# Patient Record
Sex: Female | Born: 1990
Health system: Southern US, Community
[De-identification: ages and names within clinical notes are randomized; demographics above are authoritative.]

---

## 2013-03-20 ENCOUNTER — Emergency Department (HOSPITAL_COMMUNITY)
Admission: EM | Admit: 2013-03-20 | Discharge: 2013-03-20 | Disposition: A | Discharge status: Home / Self Care | Payer: No Typology Code available for payment source | Attending: Emergency Medicine | Admitting: Emergency Medicine

## 2013-03-20 ENCOUNTER — Encounter (HOSPITAL_COMMUNITY): Payer: Self-pay | Admitting: Emergency Medicine

## 2013-03-20 ENCOUNTER — Emergency Department (HOSPITAL_COMMUNITY): Payer: No Typology Code available for payment source

## 2013-03-20 DIAGNOSIS — Y92410 Unspecified street and highway as the place of occurrence of the external cause: Secondary | ICD-10-CM | POA: Insufficient documentation

## 2013-03-20 DIAGNOSIS — Y9389 Activity, other specified: Secondary | ICD-10-CM | POA: Insufficient documentation

## 2013-03-20 DIAGNOSIS — T148XXA Other injury of unspecified body region, initial encounter: Secondary | ICD-10-CM

## 2013-03-20 DIAGNOSIS — S139XXA Sprain of joints and ligaments of unspecified parts of neck, initial encounter: Secondary | ICD-10-CM | POA: Insufficient documentation

## 2013-03-20 DIAGNOSIS — M62838 Other muscle spasm: Secondary | ICD-10-CM | POA: Insufficient documentation

## 2013-03-20 MED ORDER — IBUPROFEN 400 MG PO TABS
800.0000 mg | ORAL_TABLET | Freq: Once | ORAL | Status: AC
  Administered 2013-03-20: 800 mg via ORAL
  Filled 2013-03-20: qty 2
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
## 2013-03-23 NOTE — ED Provider Notes (Signed)
Medical screening examination/treatment/procedure(s) were performed by non-physician practitioner and as supervising physician I was immediately available for consultation/collaboration.    Margeart Allender J. Jazira Maloney, MD 03/23/13 0431 

## 2014-07-24 ENCOUNTER — Emergency Department (HOSPITAL_COMMUNITY)
Admission: EM | Admit: 2014-07-24 | Discharge: 2014-07-24 | Disposition: A | Discharge status: Home / Self Care | Payer: No Typology Code available for payment source | Attending: Emergency Medicine | Admitting: Emergency Medicine

## 2014-07-24 ENCOUNTER — Encounter (HOSPITAL_COMMUNITY): Payer: Self-pay | Admitting: *Deleted

## 2014-07-24 DIAGNOSIS — B9689 Other specified bacterial agents as the cause of diseases classified elsewhere: Secondary | ICD-10-CM

## 2014-07-24 DIAGNOSIS — N760 Acute vaginitis: Secondary | ICD-10-CM | POA: Insufficient documentation

## 2014-07-24 DIAGNOSIS — N938 Other specified abnormal uterine and vaginal bleeding: Secondary | ICD-10-CM | POA: Insufficient documentation

## 2014-07-24 DIAGNOSIS — Z3202 Encounter for pregnancy test, result negative: Secondary | ICD-10-CM | POA: Insufficient documentation

## 2014-07-24 LAB — CBC WITH DIFFERENTIAL/PLATELET
Basophils Absolute: 0 10*3/uL (ref 0.0–0.1)
Basophils Relative: 0 % (ref 0–1)
EOS PCT: 1 % (ref 0–5)
Eosinophils Absolute: 0.1 10*3/uL (ref 0.0–0.7)
HCT: 34.5 % — ABNORMAL LOW (ref 36.0–46.0)
HEMOGLOBIN: 12 g/dL (ref 12.0–15.0)
Lymphocytes Relative: 17 % (ref 12–46)
Lymphs Abs: 1.5 10*3/uL (ref 0.7–4.0)
MCH: 30.7 pg (ref 26.0–34.0)
MCHC: 34.8 g/dL (ref 30.0–36.0)
MCV: 88.2 fL (ref 78.0–100.0)
Monocytes Absolute: 0.5 10*3/uL (ref 0.1–1.0)
Monocytes Relative: 5 % (ref 3–12)
NEUTROS PCT: 77 % (ref 43–77)
Neutro Abs: 6.5 10*3/uL (ref 1.7–7.7)
Platelets: 198 10*3/uL (ref 150–400)
RBC: 3.91 MIL/uL (ref 3.87–5.11)
RDW: 12.6 % (ref 11.5–15.5)
WBC: 8.6 10*3/uL (ref 4.0–10.5)

## 2014-07-24 LAB — BASIC METABOLIC PANEL
ANION GAP: 8 (ref 5–15)
BUN: 8 mg/dL (ref 6–23)
CHLORIDE: 106 mmol/L (ref 96–112)
CO2: 26 mmol/L (ref 19–32)
CREATININE: 0.78 mg/dL (ref 0.50–1.10)
Calcium: 9.6 mg/dL (ref 8.4–10.5)
Glucose, Bld: 96 mg/dL (ref 70–99)
POTASSIUM: 4 mmol/L (ref 3.5–5.1)
Sodium: 140 mmol/L (ref 135–145)

## 2014-07-24 LAB — WET PREP, GENITAL
TRICH WET PREP: NONE SEEN
YEAST WET PREP: NONE SEEN

## 2014-07-24 LAB — URINE MICROSCOPIC-ADD ON

## 2014-07-24 LAB — URINALYSIS, ROUTINE W REFLEX MICROSCOPIC
Bilirubin Urine: NEGATIVE
Glucose, UA: NEGATIVE mg/dL
KETONES UR: NEGATIVE mg/dL
LEUKOCYTES UA: NEGATIVE
Nitrite: NEGATIVE
PROTEIN: NEGATIVE mg/dL
SPECIFIC GRAVITY, URINE: 1.027 (ref 1.005–1.030)
Urobilinogen, UA: 0.2 mg/dL (ref 0.0–1.0)
pH: 6 (ref 5.0–8.0)

## 2014-07-24 LAB — PREGNANCY, URINE: PREG TEST UR: NEGATIVE

## 2014-07-24 MED ORDER — METRONIDAZOLE 500 MG PO TABS: 500.0000 mg | ORAL_TABLET | Freq: Two times a day (BID) | ORAL | Status: DC

## 2014-07-24 NOTE — ED Notes (Signed)
Pt reports irregular menstrual cycle and heavy bleeding. No acute distress noted at triage.

## 2014-07-24 NOTE — ED Notes (Signed)
EDP at bedside  

## 2014-07-24 NOTE — Discharge Instructions (Signed)
Abnormal Uterine Bleeding °Abnormal uterine bleeding can affect women at various stages in life, including teenagers, women in their reproductive years, pregnant women, and women who have reached menopause. Several kinds of uterine bleeding are considered abnormal, including: °· Bleeding or spotting between periods.   °· Bleeding after sexual intercourse.   °· Bleeding that is heavier or more than normal.   °· Periods that last longer than usual. °· Bleeding after menopause.   °Many cases of abnormal uterine bleeding are minor and simple to treat, while others are more serious. Any type of abnormal bleeding should be evaluated by your health care provider. Treatment will depend on the cause of the bleeding. °HOME CARE INSTRUCTIONS °Monitor your condition for any changes. The following actions may help to alleviate any discomfort you are experiencing: °· Avoid the use of tampons and douches as directed by your health care provider. °· Change your pads frequently. °You should get regular pelvic exams and Pap tests. Keep all follow-up appointments for diagnostic tests as directed by your health care provider.  °SEEK MEDICAL CARE IF:  °· Your bleeding lasts more than 1 week.   °· You feel dizzy at times.   °SEEK IMMEDIATE MEDICAL CARE IF:  °· You pass out.   °· You are changing pads every 15 to 30 minutes.   °· You have abdominal pain. °· You have a fever.   °· You become sweaty or weak.   °· You are passing large blood clots from the vagina.   °· You start to feel nauseous and vomit. °MAKE SURE YOU:  °· Understand these instructions. °· Will watch your condition. °· Will get help right away if you are not doing well or get worse. °Document Released: 06/16/2005 Document Revised: 06/21/2013 Document Reviewed: 01/13/2013 °ExitCare® Patient Information ©2015 [employer]. This information is not intended to replace advice given to you by your health care provider. Make sure you discuss any questions you have with your  health care provider. ° °Bacterial Vaginosis °Bacterial vaginosis is a vaginal infection that occurs when the normal balance of bacteria in the vagina is disrupted. It results from an overgrowth of certain bacteria. This is the most common vaginal infection in women of childbearing age. Treatment is important to prevent complications, especially in pregnant women, as it can cause a premature delivery. °CAUSES  °Bacterial vaginosis is caused by an increase in harmful bacteria that are normally present in smaller amounts in the vagina. Several different kinds of bacteria can cause bacterial vaginosis. However, the reason that the condition develops is not fully understood. °RISK FACTORS °Certain activities or behaviors can put you at an increased risk of developing bacterial vaginosis, including: °· Having a new sex partner or multiple sex partners. °· Douching. °· Using an intrauterine device (IUD) for contraception. °Women do not get bacterial vaginosis from toilet seats, bedding, swimming pools, or contact with objects around them. °SIGNS AND SYMPTOMS  °Some women with bacterial vaginosis have no signs or symptoms. Common symptoms include: °· Grey vaginal discharge. °· A fishlike odor with discharge, especially after sexual intercourse. °· Itching or burning of the vagina and vulva. °· Burning or pain with urination. °DIAGNOSIS  °Your health care provider will take a medical history and examine the vagina for signs of bacterial vaginosis. A sample of vaginal fluid may be taken. Your health care provider will look at this sample under a microscope to check for bacteria and abnormal cells. A vaginal pH test may also be done.  °TREATMENT  °Bacterial vaginosis may be treated with antibiotic medicines. These   may be given in the form of a pill or a vaginal cream. A second round of antibiotics may be prescribed if the condition comes back after treatment.  °HOME CARE INSTRUCTIONS  °· Only take over-the-counter or  prescription medicines as directed by your health care provider. °· If antibiotic medicine was prescribed, take it as directed. Make sure you finish it even if you start to feel better. °· Do not have sex until treatment is completed. °· Tell all sexual partners that you have a vaginal infection. They should see their health care provider and be treated if they have problems, such as a mild rash or itching. °· Practice safe sex by using condoms and only having one sex partner. °SEEK MEDICAL CARE IF:  °· Your symptoms are not improving after 3 days of treatment. °· You have increased discharge or pain. °· You have a fever. °MAKE SURE YOU:  °· Understand these instructions. °· Will watch your condition. °· Will get help right away if you are not doing well or get worse. °FOR MORE INFORMATION  °Centers for Disease Control and Prevention, Division of STD Prevention: www.cdc.gov/std °American Sexual Health Association (ASHA): www.ashastd.org  °Document Released: 06/16/2005 Document Revised: 04/06/2013 Document Reviewed: 01/26/2013 °ExitCare® Patient Information ©2015 [employer]. This information is not intended to replace advice given to you by your health care provider. Make sure you discuss any questions you have with your health care provider. ° °

## 2014-07-24 NOTE — ED Notes (Signed)
Notified PA about pt wanting to leave. PA spoke with pt and mother and encouraged them to stay and wait for the results.

## 2014-07-24 NOTE — ED Provider Notes (Signed)
CSN: [id number]     Arrival date & time 07/24/14  1025 History   First MD Initiated Contact with Patient 07/24/14 1106     Chief Complaint  Patient presents with  . Vaginal Bleeding   Adriana Ross is a 24 y.o. female who presents to emergency room complaining of irregular menstrual cycle and heavy vaginal bleeding. Patient reports her last Adriana Ross cycle began on 07/10/2014 and lasted 5 days. She reports her menstrual cycle stopped for 3 days and then started again on 07/19/2014. Patient reports she's been soiling 4 pads per day since the 20th. Patient is G1P0010. Patient is sexually active and reports using condoms. Patient is not on any oral contraceptive pills. Patient does not have a primary care doctor or OB/GYN currently. The patient denies fevers, chills, recent illness, vaginal lesions, abdominal pain, nausea, vomiting, diarrhea, constipation, lightheadedness, dizziness, weakness, dysuria, hematuria, urinary frequency or urinary urgency.   (Consider location/radiation/quality/duration/timing/severity/associated sxs/prior Treatment) HPI  History reviewed. No pertinent past medical history. History reviewed. No pertinent past surgical history. History reviewed. No pertinent family history. History  Substance Use Topics  . Smoking status: Never Smoker   . Smokeless tobacco: Not on file  . Alcohol Use: Yes   OB History    No data available     Review of Systems  Constitutional: Negative for fever and chills.  HENT: Negative for congestion and sore throat.   Eyes: Negative for visual disturbance.  Respiratory: Negative for cough, shortness of breath and wheezing.   Cardiovascular: Negative for chest pain.  Gastrointestinal: Negative for nausea, vomiting, abdominal pain, diarrhea and blood in stool.  Genitourinary: Positive for vaginal bleeding and menstrual problem. Negative for dysuria, urgency, frequency, hematuria, decreased urine volume, vaginal discharge, difficulty  urinating, genital sores and vaginal pain.  Musculoskeletal: Negative for back pain and neck pain.  Skin: Negative for rash.  Neurological: Negative for dizziness, seizures, weakness, light-headedness, numbness and headaches.      Allergies  Review of patient's allergies indicates no known allergies.  Home Medications   Prior to Admission medications   Medication Sig Start Date End Date Taking? Authorizing Provider  metroNIDAZOLE (FLAGYL) 500 MG tablet Take 1 tablet (500 mg total) by mouth 2 (two) times daily. 07/24/14   Einar GipWilliam Duncan Gareth Fitzner, PA-C   BP 115/67 mmHg  Pulse 77  Temp(Src) 98.2 F (36.8 C) (Oral)  Resp 18  SpO2 100%  LMP 07/10/2014 Physical Exam  Constitutional: She appears well-developed and well-nourished. No distress.  HENT:  Head: Normocephalic and atraumatic.  Mouth/Throat: Oropharynx is clear and moist.  Eyes: Conjunctivae are normal. Pupils are equal, round, and reactive to light. Right eye exhibits no discharge. Left eye exhibits no discharge.  Neck: Neck supple.  Cardiovascular: Normal rate, regular rhythm, normal heart sounds and intact distal pulses.  Exam reveals no gallop and no friction rub.   No murmur heard. Pulmonary/Chest: Effort normal and breath sounds normal. No respiratory distress. She has no wheezes. She has no rales.  Abdominal: Soft. Bowel sounds are normal. She exhibits no distension and no mass. There is no tenderness. There is no rebound and no guarding.  Abdomen is soft and nontender to palpation. Bowel sounds are present. Negative psoas and obturator sign.  Genitourinary: Uterus is not deviated, not enlarged, not fixed and not tender. Cervix exhibits no motion tenderness. Right adnexum displays no mass, no tenderness and no fullness. Left adnexum displays no mass, no tenderness and no fullness. There is bleeding in the vagina. No erythema in  the vagina. No foreign body around the vagina. No signs of injury around the vagina.  Pelvic exam  performed by me with female RN chaperone. No external lesions or rashes noted. Patient has a mild amount of red blood in her vaginal vault. Cervix is closed. No abrasions or lacerations noted. No cervical motion tenderness. No adnexal fullness or tenderness noted.  Musculoskeletal: She exhibits no edema.  Lymphadenopathy:    She has no cervical adenopathy.  Neurological: She is alert. Coordination normal.  Skin: Skin is warm and dry. No rash noted. She is not diaphoretic. No erythema. No pallor.  Psychiatric: She has a normal mood and affect. Her behavior is normal.  Nursing note and vitals reviewed.   ED Course  Procedures (including critical care time) Labs Review Labs Reviewed  WET PREP, GENITAL - Abnormal; Notable for the following:    Clue Cells Wet Prep HPF POC MODERATE (*)    WBC, Wet Prep HPF POC MANY (*)    All other components within normal limits  URINALYSIS, ROUTINE W REFLEX MICROSCOPIC - Abnormal; Notable for the following:    APPearance HAZY (*)    Hgb urine dipstick MODERATE (*)    All other components within normal limits  CBC WITH DIFFERENTIAL/PLATELET - Abnormal; Notable for the following:    HCT 34.5 (*)    All other components within normal limits  URINE MICROSCOPIC-ADD ON - Abnormal; Notable for the following:    Squamous Epithelial / LPF FEW (*)    Bacteria, UA FEW (*)    All other components within normal limits  PREGNANCY, URINE  BASIC METABOLIC PANEL  HIV ANTIBODY (ROUTINE TESTING)  RPR  GC/CHLAMYDIA PROBE AMP ([place])    Imaging Review No results found.   EKG Interpretation None      Filed Vitals:   07/24/14 1230 07/24/14 1300 07/24/14 1330 07/24/14 1429  BP: 129/78 121/70 124/74 115/67  Pulse: 95 81 85 77  Temp:      TempSrc:      Resp:    18  SpO2: 99% 100% 100% 100%     MDM   Meds given in ED:  Medications - No data to display  Discharge Medication List as of 07/24/2014  2:29 PM    START taking these medications   Details   metroNIDAZOLE (FLAGYL) 500 MG tablet Take 1 tablet (500 mg total) by mouth 2 (two) times daily., Starting 07/24/2014, Until Discontinued, Print        Final diagnoses:  DUB (dysfunctional uterine bleeding)  BV (bacterial vaginosis)   This is a 24 year old female who presented to the emergency department complaining of irregular menstrual cycle. Patient reports vaginal bleeding after her menstrual cycle for the past 5 days. Patient reported soiling 4 pads per day. The patient denies any lightheadedness, dizziness or weakness. Patient is afebrile and nontoxic-appearing. On pelvic exam patient has a small amount of blood vaginally. No lacerations or lesions noted. No cervical motion tenderness noted. No adnexal tenderness. Patient's abdomen is soft and nontender to palpation. Patient's hemoglobin is 12.0. The BMP is unremarkable. Urinalysis is negative for infection. Patient is a negative pregnancy test. Patient's wet prep indicates moderate clue cells. We'll treat this patient with Flagyl for bacterial vaginosis. Will have patient follow-up with the women's outpatient clinic for dysfunctional uterine bleeding. Strict return precautions were provided. I advised the patient to follow-up with the women's outpatient clinic this week. I advised the patient to return to the emergency department with new or worsening  symptoms or new concerns. The patient and her mother verbalized understanding and agreement with plan.   This patient was discussed with Dr. Freida Busman who agrees with assessment and plan.       Lawana Chambers, PA-C 07/24/14 1731  Toy Baker, MD 07/28/14 [phone]

## 2014-07-24 NOTE — ED Notes (Signed)
Phlebotomy at bedside.

## 2014-07-25 LAB — HIV ANTIBODY (ROUTINE TESTING W REFLEX): HIV-1/HIV-2 Ab: NONREACTIVE

## 2014-07-25 LAB — GC/CHLAMYDIA PROBE AMP (~~LOC~~) NOT AT ARMC
Chlamydia: POSITIVE — AB
Neisseria Gonorrhea: NEGATIVE

## 2014-07-25 LAB — RPR: RPR Ser Ql: NONREACTIVE

## 2014-08-02 ENCOUNTER — Encounter: Payer: Self-pay | Admitting: Obstetrics and Gynecology

## 2014-08-02 ENCOUNTER — Telehealth: Payer: Self-pay | Admitting: General Practice

## 2014-08-02 DIAGNOSIS — A749 Chlamydial infection, unspecified: Secondary | ICD-10-CM

## 2014-08-02 MED ORDER — AZITHROMYCIN 250 MG PO TABS: 250.0000 mg | ORAL_TABLET | Freq: Once | ORAL | Status: DC

## 2014-08-02 NOTE — Telephone Encounter (Signed)
Called patient to discuss mychart message/treatment- no answer. Left message that we are trying to reach you, please call us back at the clinics

## 2014-08-02 NOTE — Telephone Encounter (Signed)
Patient called back to front office. Apologized to patient for delay in care and told her we would send a Rx to her pharmacy to treat this and that she needs to inform her partner(s) so they can get treated as well. Also discussed she needs to abstain for the next 2 weeks. Told patient that at her follow up appt in our office we can repeat the test to ensure the infection is gone. Patient verbalized understanding to all and had no other questions

## 2014-08-18 ENCOUNTER — Encounter: Payer: Self-pay | Admitting: Obstetrics and Gynecology

## 2014-08-23 ENCOUNTER — Ambulatory Visit (INDEPENDENT_AMBULATORY_CARE_PROVIDER_SITE_OTHER): Payer: Self-pay | Admitting: Obstetrics and Gynecology

## 2014-08-23 ENCOUNTER — Encounter: Payer: Self-pay | Admitting: Obstetrics and Gynecology

## 2014-08-23 VITALS — BP 127/78 | HR 86 | Temp 98.1°F | Ht 62.0 in | Wt 109.1 lb

## 2014-08-23 DIAGNOSIS — N939 Abnormal uterine and vaginal bleeding, unspecified: Secondary | ICD-10-CM

## 2014-08-23 NOTE — Progress Notes (Signed)
Patient ID: Adriana Ross, female   DOB: 05/08/1991, 24 y.o.   MRN: [id number] 24 yo G1P0010 who is here for the evaluation of abnormal uterine bleeding. Patient normally experiences monthly 5-day cycles changing 4 pads per day. In January however, she experienced a normal cycle followed by 1 week of spotting. She has never experienced that in the past. She was treated fro BV and chlamydia and states the bleeding resolved. She is sexually active using condoms at times.   History reviewed. No pertinent past medical history. History reviewed. No pertinent past surgical history. History reviewed. No pertinent family history. History  Substance Use Topics  . Smoking status: Former Games developermoker  . Smokeless tobacco: Never Used  . Alcohol Use: Yes   Physical exam  GENERAL: Well-developed, well-nourished female in no acute distress.  EXTREMITIES: No cyanosis, clubbing, or edema, 2+ distal pulses.  A/P 24 yo with a one time episode of abnormal uterine bleeding likely secondary to cervicitis - reassurance provided - Discussed contraception options. Patient will return to let us know - RTC for annual exam - Advised the use of condoms with every sexual encounter

## 2014-08-23 NOTE — Patient Instructions (Signed)
Contraception Choices Contraception (birth control) is the use of any methods or devices to prevent pregnancy. Below are some methods to help avoid pregnancy. HORMONAL METHODS   Contraceptive implant. This is a thin, plastic tube containing progesterone hormone. It does not contain estrogen hormone. Your health care provider inserts the tube in the inner part of the upper arm. The tube can remain in place for up to 3 years. After 3 years, the implant must be removed. The implant prevents the ovaries from releasing an egg (ovulation), thickens the cervical mucus to prevent sperm from entering the uterus, and thins the lining of the inside of the uterus.  Progesterone-only injections. These injections are given every 3 months by your health care provider to prevent pregnancy. This synthetic progesterone hormone stops the ovaries from releasing eggs. It also thickens cervical mucus and changes the uterine lining. This makes it harder for sperm to survive in the uterus.  Birth control pills. These pills contain estrogen and progesterone hormone. They work by preventing the ovaries from releasing eggs (ovulation). They also cause the cervical mucus to thicken, preventing the sperm from entering the uterus. Birth control pills are prescribed by a health care provider.Birth control pills can also be used to treat heavy periods.  Minipill. This type of birth control pill contains only the progesterone hormone. They are taken every day of each month and must be prescribed by your health care provider.  Birth control patch. The patch contains hormones similar to those in birth control pills. It must be changed once a week and is prescribed by a health care provider.  Vaginal ring. The ring contains hormones similar to those in birth control pills. It is left in the vagina for 3 weeks, removed for 1 week, and then a new one is put back in place. The patient must be comfortable inserting and removing the ring  from the vagina.A health care provider's prescription is necessary.  Emergency contraception. Emergency contraceptives prevent pregnancy after unprotected sexual intercourse. This pill can be taken right after sex or up to 5 days after unprotected sex. It is most effective the sooner you take the pills after having sexual intercourse. Most emergency contraceptive pills are available without a prescription. Check with your pharmacist. Do not use emergency contraception as your only form of birth control. BARRIER METHODS   Female condom. This is a thin sheath (latex or rubber) that is worn over the penis during sexual intercourse. It can be used with spermicide to increase effectiveness.  Female condom. This is a soft, loose-fitting sheath that is put into the vagina before sexual intercourse.  Diaphragm. This is a soft, latex, dome-shaped barrier that must be fitted by a health care provider. It is inserted into the vagina, along with a spermicidal jelly. It is inserted before intercourse. The diaphragm should be left in the vagina for 6 to 8 hours after intercourse.  Cervical cap. This is a round, soft, latex or plastic cup that fits over the cervix and must be fitted by a health care provider. The cap can be left in place for up to 48 hours after intercourse.  Sponge. This is a soft, circular piece of polyurethane foam. The sponge has spermicide in it. It is inserted into the vagina after wetting it and before sexual intercourse.  Spermicides. These are chemicals that kill or block sperm from entering the cervix and uterus. They come in the form of creams, jellies, suppositories, foam, or tablets. They do not require a   prescription. They are inserted into the vagina with an applicator before having sexual intercourse. The process must be repeated every time you have sexual intercourse. INTRAUTERINE CONTRACEPTION  Intrauterine device (IUD). This is a T-shaped device that is put in a woman's uterus  during a menstrual period to prevent pregnancy. There are 2 types:  Copper IUD. This type of IUD is wrapped in copper wire and is placed inside the uterus. Copper makes the uterus and fallopian tubes produce a fluid that kills sperm. It can stay in place for 10 years.  Hormone IUD. This type of IUD contains the hormone progestin (synthetic progesterone). The hormone thickens the cervical mucus and prevents sperm from entering the uterus, and it also thins the uterine lining to prevent implantation of a fertilized egg. The hormone can weaken or kill the sperm that get into the uterus. It can stay in place for 3-5 years, depending on which type of IUD is used. PERMANENT METHODS OF CONTRACEPTION  Female tubal ligation. This is when the woman's fallopian tubes are surgically sealed, tied, or blocked to prevent the egg from traveling to the uterus.  Hysteroscopic sterilization. This involves placing a small coil or insert into each fallopian tube. Your doctor uses a technique called hysteroscopy to do the procedure. The device causes scar tissue to form. This results in permanent blockage of the fallopian tubes, so the sperm cannot fertilize the egg. It takes about 3 months after the procedure for the tubes to become blocked. You must use another form of birth control for these 3 months.  Female sterilization. This is when the female has the tubes that carry sperm tied off (vasectomy).This blocks sperm from entering the vagina during sexual intercourse. After the procedure, the man can still ejaculate fluid (semen). NATURAL PLANNING METHODS  Natural family planning. This is not having sexual intercourse or using a barrier method (condom, diaphragm, cervical cap) on days the woman could become pregnant.  Calendar method. This is keeping track of the length of each menstrual cycle and identifying when you are fertile.  Ovulation method. This is avoiding sexual intercourse during ovulation.  Symptothermal  method. This is avoiding sexual intercourse during ovulation, using a thermometer and ovulation symptoms.  Post-ovulation method. This is timing sexual intercourse after you have ovulated. Regardless of which type or method of contraception you choose, it is important that you use condoms to protect against the transmission of sexually transmitted infections (STIs). Talk with your health care provider about which form of contraception is most appropriate for you. Document Released: 06/16/2005 Document Revised: 06/21/2013 Document Reviewed: 12/09/2012 ExitCare Patient Information 2015 [employer]. This information is not intended to replace advice given to you by your health care provider. Make sure you discuss any questions you have with your health care provider.  

## 2014-10-02 ENCOUNTER — Ambulatory Visit: Payer: BC Managed Care – PPO | Admitting: Obstetrics and Gynecology

## 2014-11-01 ENCOUNTER — Encounter: Payer: Self-pay | Admitting: *Deleted

## 2014-11-02 ENCOUNTER — Ambulatory Visit: Payer: BC Managed Care – PPO | Admitting: Obstetrics and Gynecology

## 2015-02-07 ENCOUNTER — Telehealth (HOSPITAL_COMMUNITY): Payer: Self-pay

## 2016-08-14 ENCOUNTER — Encounter: Payer: Self-pay | Admitting: Family Medicine

## 2016-08-14 ENCOUNTER — Ambulatory Visit (INDEPENDENT_AMBULATORY_CARE_PROVIDER_SITE_OTHER): Payer: BLUE CROSS/BLUE SHIELD | Admitting: Family Medicine

## 2016-08-14 VITALS — BP 108/73 | HR 108 | Wt 108.5 lb

## 2016-08-14 DIAGNOSIS — Z01419 Encounter for gynecological examination (general) (routine) without abnormal findings: Secondary | ICD-10-CM

## 2016-08-14 DIAGNOSIS — Z113 Encounter for screening for infections with a predominantly sexual mode of transmission: Secondary | ICD-10-CM

## 2016-08-14 DIAGNOSIS — Z01411 Encounter for gynecological examination (general) (routine) with abnormal findings: Secondary | ICD-10-CM

## 2016-08-14 DIAGNOSIS — Z124 Encounter for screening for malignant neoplasm of cervix: Secondary | ICD-10-CM

## 2016-08-14 DIAGNOSIS — Z0000 Encounter for general adult medical examination without abnormal findings: Secondary | ICD-10-CM | POA: Diagnosis not present

## 2016-08-14 DIAGNOSIS — R87610 Atypical squamous cells of undetermined significance on cytologic smear of cervix (ASC-US): Secondary | ICD-10-CM | POA: Diagnosis not present

## 2016-08-14 DIAGNOSIS — Z3009 Encounter for other general counseling and advice on contraception: Secondary | ICD-10-CM

## 2016-08-14 LAB — HIV ANTIBODY (ROUTINE TESTING W REFLEX): HIV: NONREACTIVE

## 2016-08-14 NOTE — Patient Instructions (Signed)
Contraception Choices Birth control (contraception) is the use of any methods or devices to stop pregnancy from happening. Below are some methods to help avoid pregnancy. Hormonal birth control  A small tube put under the skin of the upper arm (implant). The tube can stay in place for 3 years. The implant must be taken out after 3 years.  Shots given every 3 months.  Pills taken every day.  Patches that are changed once a week.  A ring put into the vagina (vaginal ring). The ring is left in place for 3 weeks and removed for 1 week. Then, a new ring is put in the vagina.  Emergency birth control pills taken after unprotected sex (intercourse). Barrier birth control  A thin covering worn on the penis (female condom) during sex.  A soft, loose covering put into the vagina (female condom) before sex.  A rubber bowl that sits over the cervix (diaphragm). The bowl must be made for you. The bowl is put into the vagina before sex. The bowl is left in place for 6 to 8 hours after sex.  A small, soft cup that fits over the cervix (cervical cap). The cup must be made for you. The cup can be left in place for 48 hours after sex.  A sponge that is put into the vagina before sex.  A chemical that kills or stops sperm from getting into the cervix and uterus (spermicide). The chemical may be a cream, jelly, foam, or pill. Intrauterine (IUD) birth control  IUD birth control is a small, T-shaped piece of plastic. The plastic is put inside the uterus. There are 2 types of IUD:  Copper IUD. The IUD is covered in copper wire. The copper makes a fluid that kills sperm. It can stay in place for 10 years.  Hormone IUD. The hormone stops pregnancy from happening. It can stay in place for 5 years. Permanent methods  When the woman has her fallopian tubes sealed, tied, or blocked during surgery. This stops the egg from traveling to the uterus.  The doctor places a small coil or insert into each fallopian  tube. This causes scar tissue to form and blocks the fallopian tubes.  When the female has the tubes that carry sperm tied off (vasectomy). Natural family planning birth control  Natural family planning means not having sex or using barrier birth control on the days the woman could become pregnant.  Use a calendar to keep track of the length of each period and know the days she can get pregnant.  Avoid sex during ovulation.  Use a thermometer to measure body temperature. Also watch for symptoms of ovulation.  Time sex to be after the woman has ovulated. Use condoms to help protect yourself against sexually transmitted infections (STIs). Do this no matter what type of birth control you use. Talk to your doctor about which type of birth control is best for you. This information is not intended to replace advice given to you by your health care provider. Make sure you discuss any questions you have with your health care provider. Document Released: 04/13/2009 Document Revised: 11/22/2015 Document Reviewed: 01/05/2013 Elsevier Interactive Patient Education  2017 [place] 18-39 Years, Female Preventive care refers to lifestyle choices and visits with your health care provider that can promote health and wellness. What does preventive care include?  A yearly physical exam. This is also called an annual well check.  Dental exams once or twice a year.  Routine  eye exams. Ask your health care provider how often you should have your eyes checked.  Personal lifestyle choices, including:  Daily care of your teeth and gums.  Regular physical activity.  Eating a healthy diet.  Avoiding tobacco and drug use.  Limiting alcohol use.  Practicing safe sex.  Taking vitamin and mineral supplements as recommended by your health care provider. What happens during an annual well check? The services and screenings done by your health care provider during your annual well check  will depend on your age, overall health, lifestyle risk factors, and family history of disease. Counseling  Your health care provider may ask you questions about your:  Alcohol use.  Tobacco use.  Drug use.  Emotional well-being.  Home and relationship well-being.  Sexual activity.  Eating habits.  Work and work Statistician.  Method of birth control.  Menstrual cycle.  Pregnancy history. Screening  You may have the following tests or measurements:  Height, weight, and BMI.  Diabetes screening. This is done by checking your blood sugar (glucose) after you have not eaten for a while (fasting).  Blood pressure.  Lipid and cholesterol levels. These may be checked every 5 years starting at age 50.  Skin check.  Hepatitis C blood test.  Hepatitis B blood test.  Sexually transmitted disease (STD) testing.  BRCA-related cancer screening. This may be done if you have a family history of breast, ovarian, tubal, or peritoneal cancers.  Pelvic exam and Pap test. This may be done every 3 years starting at age 77. Starting at age 71, this may be done every 5 years if you have a Pap test in combination with an HPV test. Discuss your test results, treatment options, and if necessary, the need for more tests with your health care provider. Vaccines  Your health care provider may recommend certain vaccines, such as:  Influenza vaccine. This is recommended every year.  Tetanus, diphtheria, and acellular pertussis (Tdap, Td) vaccine. You may need a Td booster every 10 years.  Varicella vaccine. You may need this if you have not been vaccinated.  HPV vaccine. If you are 56 or younger, you may need three doses over 6 months.  Measles, mumps, and rubella (MMR) vaccine. You may need at least one dose of MMR. You may also need a second dose.  Pneumococcal 13-valent conjugate (PCV13) vaccine. You may need this if you have certain conditions and were not previously  vaccinated.  Pneumococcal polysaccharide (PPSV23) vaccine. You may need one or two doses if you smoke cigarettes or if you have certain conditions.  Meningococcal vaccine. One dose is recommended if you are age 58-21 years and a first-year college student living in a residence hall, or if you have one of several medical conditions. You may also need additional booster doses.  Hepatitis A vaccine. You may need this if you have certain conditions or if you travel or work in places where you may be exposed to hepatitis A.  Hepatitis B vaccine. You may need this if you have certain conditions or if you travel or work in places where you may be exposed to hepatitis B.  Haemophilus influenzae type b (Hib) vaccine. You may need this if you have certain risk factors. Talk to your health care provider about which screenings and vaccines you need and how often you need them. This information is not intended to replace advice given to you by your health care provider. Make sure you discuss any questions you have with your  health care provider. Document Released: 08/12/2001 Document Revised: 03/05/2016 Document Reviewed: 04/17/2015 Elsevier Interactive Patient Education  2017 [employer].

## 2016-08-14 NOTE — Progress Notes (Signed)
   Subjective:     Adriana ChiquitoShaneka Puerto is a 26 y.o. female and is here for a comprehensive physical exam. The patient reports no problems. Thinks she had Gardasil series  Social History   Social History  . Marital status: Single    Spouse name: N/A  . Number of children: N/A  . Years of education: N/A   Occupational History  . Not on file.   Social History Main Topics  . Smoking status: Former Games developermoker  . Smokeless tobacco: Never Used  . Alcohol use Yes  . Drug use: No  . Sexual activity: Yes    Birth control/ protection: Condom   Other Topics Concern  . Not on file   Social History Narrative  . No narrative on file   Health Maintenance  Topic Date Due  . TETANUS/TDAP  06/24/2012  . PAP SMEAR  06/24/2014  . INFLUENZA VACCINE  01/29/2016  . HIV Screening  Completed    The following portions of the patient's history were reviewed and updated as appropriate: allergies, current medications, past family history, past medical history, past social history, past surgical history and problem list.  Review of Systems Pertinent items noted in HPI and remainder of comprehensive ROS otherwise negative.   Objective:    BP 108/73   Pulse (!) 108   Wt 108 lb 8 oz (49.2 kg)   BMI 19.84 kg/m  General appearance: alert, cooperative and appears stated age Head: Normocephalic, without obvious abnormality, atraumatic Neck: no adenopathy, supple, symmetrical, trachea midline and thyroid not enlarged, symmetric, no tenderness/mass/nodules Lungs: clear to auscultation bilaterally Breasts: normal appearance, no masses or tenderness, bilateral nipple piercings Heart: regular rate and rhythm, S1, S2 normal, no murmur, click, rub or gallop Abdomen: soft, non-tender; bowel sounds normal; no masses,  no organomegaly Pelvic: cervix normal in appearance, external genitalia normal, no adnexal masses or tenderness, no cervical motion tenderness, uterus normal size, shape, and consistency and frothy  discharge Extremities: extremities normal, atraumatic, no cyanosis or edema Pulses: 2+ and symmetric Skin: Skin color, texture, turgor normal. No rashes or lesions Lymph nodes: Cervical, supraclavicular, and axillary nodes normal. Neurologic: Grossly normal    Assessment:    Healthy female exam.      Plan:      Problem List Items Addressed This Visit    None    Visit Diagnoses    Screen for STD (sexually transmitted disease)    -  Primary   Relevant Orders   RPR   HIV antibody   Screening for malignant neoplasm of cervix       Relevant Orders   Cytology - PAP   Encounter for gynecological examination with abnormal finding       Birth control counseling        Undecided on method of contraception. Previously on Depo, declines that. Not interested in IUD or Nexplanon. Concerned about taking OCs and cost and remembering to take. Uses condoms with one partner and pull-out method with another. Information given.  See After Visit Summary for Counseling Recommendations

## 2016-08-15 LAB — RPR

## 2016-08-19 LAB — CYTOLOGY - PAP
BACTERIAL VAGINITIS: POSITIVE — AB
CANDIDA VAGINITIS: NEGATIVE
Chlamydia: NEGATIVE
Diagnosis: UNDETERMINED — AB
HPV (WINDOPATH): NOT DETECTED
NEISSERIA GONORRHEA: NEGATIVE
Trichomonas: NEGATIVE

## 2016-08-20 ENCOUNTER — Encounter: Payer: Self-pay | Admitting: General Practice

## 2016-08-20 ENCOUNTER — Other Ambulatory Visit: Payer: Self-pay | Admitting: General Practice

## 2016-08-20 DIAGNOSIS — N760 Acute vaginitis: Principal | ICD-10-CM

## 2016-08-20 DIAGNOSIS — B9689 Other specified bacterial agents as the cause of diseases classified elsewhere: Secondary | ICD-10-CM

## 2016-08-20 MED ORDER — METRONIDAZOLE 500 MG PO TABS: 500.0000 mg | ORAL_TABLET | Freq: Two times a day (BID) | ORAL | 0 refills | Status: DC

## 2017-01-19 DIAGNOSIS — N898 Other specified noninflammatory disorders of vagina: Secondary | ICD-10-CM | POA: Diagnosis not present

## 2017-07-01 DIAGNOSIS — N760 Acute vaginitis: Secondary | ICD-10-CM | POA: Diagnosis not present

## 2017-10-19 ENCOUNTER — Encounter: Payer: Self-pay | Admitting: *Deleted

## 2017-10-19 ENCOUNTER — Ambulatory Visit (INDEPENDENT_AMBULATORY_CARE_PROVIDER_SITE_OTHER): Payer: BLUE CROSS/BLUE SHIELD | Admitting: *Deleted

## 2017-10-19 VITALS — BP 113/65 | HR 91

## 2017-10-19 DIAGNOSIS — B9689 Other specified bacterial agents as the cause of diseases classified elsewhere: Secondary | ICD-10-CM

## 2017-10-19 DIAGNOSIS — N760 Acute vaginitis: Secondary | ICD-10-CM

## 2017-10-19 DIAGNOSIS — Z113 Encounter for screening for infections with a predominantly sexual mode of transmission: Secondary | ICD-10-CM | POA: Diagnosis not present

## 2017-10-19 NOTE — Progress Notes (Signed)
Pt presents to clinic for sti testing. Denies any vag d/c or complaints. Had unprotected intercourse and just wants testing. Pt does not want HIV testing. Explained will get results via mychart in couple days. If anything positive we have verified pharmacy info is correct and we will call in any needed medication for her.

## 2017-10-19 NOTE — Progress Notes (Signed)
I have reviewed the chart and agree with nursing staff's documentation of this patient's encounter.  Adriana CollinsUgonna Keona Bilyeu, MD 10/19/2017 4:51 PM

## 2017-10-20 LAB — CERVICOVAGINAL ANCILLARY ONLY
BACTERIAL VAGINITIS: POSITIVE — AB
Candida vaginitis: NEGATIVE
Chlamydia: NEGATIVE
Neisseria Gonorrhea: NEGATIVE
TRICH (WINDOWPATH): NEGATIVE

## 2017-10-20 MED ORDER — TINIDAZOLE 500 MG PO TABS: 2.0000 g | ORAL_TABLET | Freq: Every day | ORAL | 2 refills | Status: DC

## 2017-10-20 NOTE — Addendum Note (Signed)
Addended by: Jaynie CollinsANYANWU, Bethann Qualley A on: 10/20/2017 03:29 PM   Modules accepted: Orders

## 2017-12-11 DIAGNOSIS — N925 Other specified irregular menstruation: Secondary | ICD-10-CM | POA: Diagnosis not present

## 2017-12-11 DIAGNOSIS — A609 Anogenital herpesviral infection, unspecified: Secondary | ICD-10-CM | POA: Diagnosis not present

## 2018-07-16 DIAGNOSIS — Z6821 Body mass index (BMI) 21.0-21.9, adult: Secondary | ICD-10-CM | POA: Diagnosis not present

## 2018-07-16 DIAGNOSIS — Z3480 Encounter for supervision of other normal pregnancy, unspecified trimester: Secondary | ICD-10-CM | POA: Diagnosis not present

## 2018-07-16 DIAGNOSIS — O99019 Anemia complicating pregnancy, unspecified trimester: Secondary | ICD-10-CM | POA: Diagnosis not present

## 2018-07-16 DIAGNOSIS — N925 Other specified irregular menstruation: Secondary | ICD-10-CM | POA: Diagnosis not present

## 2018-07-16 DIAGNOSIS — Z3201 Encounter for pregnancy test, result positive: Secondary | ICD-10-CM | POA: Diagnosis not present

## 2018-07-16 LAB — OB RESULTS CONSOLE RUBELLA ANTIBODY, IGM: Rubella: IMMUNE

## 2018-07-16 LAB — OB RESULTS CONSOLE HEPATITIS B SURFACE ANTIGEN: Hepatitis B Surface Ag: NEGATIVE

## 2018-08-12 DIAGNOSIS — Z369 Encounter for antenatal screening, unspecified: Secondary | ICD-10-CM | POA: Diagnosis not present

## 2018-08-12 DIAGNOSIS — Z3480 Encounter for supervision of other normal pregnancy, unspecified trimester: Secondary | ICD-10-CM | POA: Diagnosis not present

## 2018-09-07 ENCOUNTER — Encounter: Payer: Self-pay | Admitting: Obstetrics

## 2018-09-07 ENCOUNTER — Other Ambulatory Visit (HOSPITAL_COMMUNITY)
Admission: RE | Admit: 2018-09-07 | Discharge: 2018-09-07 | Disposition: A | Discharge status: Home / Self Care | Payer: BLUE CROSS/BLUE SHIELD | Source: Ambulatory Visit

## 2018-09-07 ENCOUNTER — Ambulatory Visit (INDEPENDENT_AMBULATORY_CARE_PROVIDER_SITE_OTHER): Payer: BLUE CROSS/BLUE SHIELD

## 2018-09-07 ENCOUNTER — Other Ambulatory Visit (HOSPITAL_COMMUNITY)
Admission: RE | Admit: 2018-09-07 | Discharge: 2018-09-07 | Disposition: A | Discharge status: Home / Self Care | Payer: Managed Care, Other (non HMO) | Source: Ambulatory Visit

## 2018-09-07 VITALS — BP 118/67 | HR 90 | Wt 124.0 lb

## 2018-09-07 DIAGNOSIS — O98812 Other maternal infectious and parasitic diseases complicating pregnancy, second trimester: Secondary | ICD-10-CM

## 2018-09-07 DIAGNOSIS — Z3490 Encounter for supervision of normal pregnancy, unspecified, unspecified trimester: Secondary | ICD-10-CM | POA: Insufficient documentation

## 2018-09-07 DIAGNOSIS — B3731 Acute candidiasis of vulva and vagina: Secondary | ICD-10-CM

## 2018-09-07 DIAGNOSIS — B373 Candidiasis of vulva and vagina: Secondary | ICD-10-CM | POA: Insufficient documentation

## 2018-09-07 DIAGNOSIS — Z9289 Personal history of other medical treatment: Secondary | ICD-10-CM

## 2018-09-07 DIAGNOSIS — Z3A15 15 weeks gestation of pregnancy: Secondary | ICD-10-CM

## 2018-09-07 DIAGNOSIS — Z3481 Encounter for supervision of other normal pregnancy, first trimester: Secondary | ICD-10-CM

## 2018-09-07 MED ORDER — TERCONAZOLE 0.4 % VA CREA: 1.0000 | TOPICAL_CREAM | Freq: Every day | VAGINAL | 0 refills | Status: DC

## 2018-09-07 NOTE — Progress Notes (Signed)
Subjective:   Adriana Ross is a 28 y.o. G2P0010 at [redacted]w[redacted]d by LMP being seen today for her first obstetrical visit s/p transfer of care. She has had a TAB around age 57, but otherwise has had no viable pregnancies. Patient does intend to breast feed. Medical history fully reviewed.  She endorses a history of marijuana usage and reports discontinuation in December.  She also reports an "outbreak" and was told that she had HPV, but was later told it was HSV.  Patient is currently taking iron supplementation from previous provider citing anemia.    Patient reports no complaints or vaginal concerns.  She currently works night shift at [employer] and endorses some mild N/V upon waking that subsides without intervention.    HISTORY: OB History  Gravida Para Term Preterm AB Living  2 0 0 0 1 0  SAB TAB Ectopic Multiple Live Births  0 1 0 0 0    # Outcome Date GA Lbr Len/2nd Weight Sex Delivery Anes PTL Lv  2 Current           1 TAB             Last pap smear was done 2018 and was abnormal - ASCUS  History reviewed. No pertinent past medical history. History reviewed. No pertinent surgical history. Family History  Problem Relation Age of Onset  . Cancer Maternal Grandmother    Social History   Tobacco Use  . Smoking status: Former Games developer  . Smokeless tobacco: Never Used  Substance Use Topics  . Alcohol use: Yes  . Drug use: Yes    Types: Marijuana   No Known Allergies Current Outpatient Medications on File Prior to Visit  Medication Sig Dispense Refill  . Prenat-Fe Carbonyl-FA-Omega 3 (ONE-A-DAY WOMENS PRENATAL 1 PO) Take by mouth.     No current facility-administered medications on file prior to visit.     Review of Systems Pertinent items noted in HPI and remainder of comprehensive ROS otherwise negative.  Exam   Vitals:   09/07/18 0926  BP: 118/67  Pulse: 90  Weight: 124 lb (56.2 kg)   Fetal Heart Rate (bpm): 145  Physical Exam Exam conducted with a chaperone  present.  Constitutional:      Appearance: Normal appearance.  HENT:     Head: Normocephalic and atraumatic.  Eyes:     Conjunctiva/sclera: Conjunctivae normal.     Pupils: Pupils are equal, round, and reactive to light.  Neck:     Musculoskeletal: Normal range of motion.  Cardiovascular:     Rate and Rhythm: Normal rate and regular rhythm.     Heart sounds: Normal heart sounds.  Pulmonary:     Effort: Pulmonary effort is normal.     Breath sounds: Normal breath sounds.  Abdominal:     General: Abdomen is flat. Bowel sounds are normal.     Palpations: Abdomen is soft.     Tenderness: There is no abdominal tenderness.     Comments: Fundus palpated U/-2  Genitourinary:    General: Normal vulva.     Vagina: Vaginal discharge present. No bleeding.     Cervix: Friability present. No cervical motion tenderness, lesion or cervical bleeding.     Uterus: Enlarged (Gravid).      Comments: Speculum Exam: -Vaginal Vault: Pink mucosa.  Copious amt of moderate white discharge in vault -wet prep collected -Cervix:Pink, no lesions, cysts, or polyps. White plaques noted. Os Appears closed. No active bleeding  from os-GC/CT  collected-Friable -Uterus: Gravid, No Fundal Tenderness Musculoskeletal: Normal range of motion.  Skin:    General: Skin is warm and dry.  Neurological:     Mental Status: She is alert and oriented to person, place, and time.  Psychiatric:        Mood and Affect: Mood normal.        Behavior: Behavior normal.        Thought Content: Thought content normal.    Assessment:   Pregnancy: G2P0010 Patient Active Problem List   Diagnosis Date Noted  . Encounter for supervision of low-risk pregnancy, antepartum 09/07/2018     Plan:  1. Encounter for supervision of low-risk pregnancy, antepartum -Welcomed and introduced to practice including nature of [facility] [employer]. -Anticipatory Guidance for [facility] including regular scheduled visits, baby scripts, or  Centering. *Patient opts for regular PNV -Discussed proper nutrition in pregnancy to avoid N/V.  -Encouraged to continue Fe+ supplementation; Discussed taking QOD if constipation develops -Physical exam as above with testing: * Cytology - PAP( [place]) * Cervicovaginal ancillary only  2. Vaginal candidiasis -Informed of apparent yeast infection based on exam -Will send Rx to sent to pharmacy on file; Terazol 7 0.4 -Instructed to complete in it's entirety - Cervicovaginal ancillary only  3. History of herpes evaluations -Patient unsure of whether outbreak was herpetic or warts. -Will obtain blood sample and give diagnosis based on findings and history. -Discussed need for treatment, at 35 weeks, if positive findings. -No questions or concerns -HSV(herpes smplx)abs-1+2(IgG+IgM)-bld  Initial labs, from previous provider, reviewed with patient  Continue prenatal vitamins. Genetic Screening discussed, First trimester screen, Quad screen and NIPS: undecided. Ultrasound discussed; fetal anatomic survey: ordered. Problem list reviewed and updated. The nature of [facility] with multiple MDs and other Advanced Practice Providers was explained to patient; also emphasized that residents, students are part of our team. Routine obstetric precautions reviewed. Return in about 4 weeks (around 10/05/2018) for ROB.    Cherre Robins, CNM 09/07/2018 12:06 PM

## 2018-09-07 NOTE — Patient Instructions (Addendum)

## 2018-09-08 LAB — CERVICOVAGINAL ANCILLARY ONLY
Bacterial vaginitis: NEGATIVE
Candida vaginitis: POSITIVE — AB
Chlamydia: NEGATIVE
Neisseria Gonorrhea: NEGATIVE
Trichomonas: NEGATIVE

## 2018-09-09 DIAGNOSIS — B009 Herpesviral infection, unspecified: Secondary | ICD-10-CM | POA: Insufficient documentation

## 2018-09-09 LAB — HSV(HERPES SMPLX)ABS-I+II(IGG+IGM)-BLD
HSV 1 Glycoprotein G Ab, IgG: 16.5 index — ABNORMAL HIGH (ref 0.00–0.90)
HSV 2 IgG, Type Spec: <0.91 index (ref 0.00–0.90)
HSVI/II COMB AB IGM: 1.72 ratio — AB (ref 0.00–0.90)

## 2018-09-10 LAB — CYTOLOGY - PAP
Diagnosis: UNDETERMINED — AB
HPV: NOT DETECTED

## 2018-10-05 ENCOUNTER — Other Ambulatory Visit: Payer: Self-pay

## 2018-10-05 ENCOUNTER — Ambulatory Visit (INDEPENDENT_AMBULATORY_CARE_PROVIDER_SITE_OTHER): Payer: BLUE CROSS/BLUE SHIELD | Admitting: Student

## 2018-10-05 DIAGNOSIS — Z3A19 19 weeks gestation of pregnancy: Secondary | ICD-10-CM

## 2018-10-05 DIAGNOSIS — Z3492 Encounter for supervision of normal pregnancy, unspecified, second trimester: Secondary | ICD-10-CM

## 2018-10-05 DIAGNOSIS — Z3490 Encounter for supervision of normal pregnancy, unspecified, unspecified trimester: Secondary | ICD-10-CM

## 2018-10-05 NOTE — Addendum Note (Signed)
Addended by: Chrystie Nose on: 10/05/2018 04:33 PM   Modules accepted: Level of Service

## 2018-10-05 NOTE — Progress Notes (Signed)
Patient ID: Adriana Ross, female   DOB: 1990-08-13, 28 y.o.   MRN: [id number]   TELEHEALTH VIRTUAL OBSTETRICS VISIT ENCOUNTER NOTE  I connected with Adriana Ross on 10/05/18 at  9:35 AM EDT by telephone at home and verified that I am speaking with the correct person using two identifiers.   I discussed the limitations, risks, security and privacy concerns of performing an evaluation and management service by telephone and the availability of in person appointments. I also discussed with the patient that there may be a patient responsible charge related to this service. The patient expressed understanding and agreed to proceed.  Subjective:  Adriana Ross is a 28 y.o. G2P0010 at [redacted]w[redacted]d being followed for ongoing prenatal care.  She is currently monitored for the following issues for this low-risk pregnancy and has Encounter for supervision of low-risk pregnancy, antepartum and HSV infection on their problem list.  Patient reports no complaints. Reports fetal movement. Denies any contractions, bleeding or leaking of fluid.   The following portions of the patient's history were reviewed and updated as appropriate: allergies, current medications, past family history, past medical history, past social history, past surgical history and problem list.   Objective:   General:  Alert, oriented and cooperative.   Mental Status: Normal mood and affect perceived. Normal judgment and thought content.  Rest of physical exam deferred due to type of encounter  Assessment and Plan:  Pregnancy: G2P0010 at [redacted]w[redacted]d 1. Encounter for supervision of low-risk pregnancy, antepartum -Patient will come and pick up BP cuff at the office -Reviewed warning signs and when to come to MAU.  - Babyscripts Schedule Optimization - [place] MFM OB COMP + 14 WK; Future -2 hour next visit  Preterm labor symptoms and general obstetric precautions including but not limited to vaginal bleeding, contractions, leaking of fluid and  fetal movement were reviewed in detail with the patient.  I discussed the assessment and treatment plan with the patient. The patient was provided an opportunity to ask questions and all were answered. The patient agreed with the plan and demonstrated an understanding of the instructions. The patient was advised to call back or seek an in-person office evaluation/go to [facility]U at [facility] for any urgent or concerning symptoms. Please refer to After Visit Summary for other counseling recommendations.   I provided 15 minutes of non-face-to-face time during this encounter.  Return in about 8 weeks (around 11/30/2018), or LROB.  Future Appointments  Date Time Provider Department Center  12/01/2018 10:00 AM Sharyon Cable, CNM [facility] None    Marylene Land, [place] [facility] for [employer], [facility]

## 2018-10-05 NOTE — Progress Notes (Signed)
S/w pt via tele visit, pt reports that she has not started feeling fetal movement yet, denies pain. Pt does not have acces to home BP cuff

## 2018-10-19 ENCOUNTER — Ambulatory Visit (HOSPITAL_COMMUNITY)
Admission: RE | Admit: 2018-10-19 | Discharge: 2018-10-19 | Disposition: A | Discharge status: Home / Self Care | Payer: BLUE CROSS/BLUE SHIELD | Source: Ambulatory Visit | Attending: Obstetrics and Gynecology | Admitting: Obstetrics and Gynecology

## 2018-10-19 ENCOUNTER — Other Ambulatory Visit: Payer: Self-pay

## 2018-10-19 DIAGNOSIS — O98512 Other viral diseases complicating pregnancy, second trimester: Secondary | ICD-10-CM

## 2018-10-19 DIAGNOSIS — Z3A21 21 weeks gestation of pregnancy: Secondary | ICD-10-CM

## 2018-10-19 DIAGNOSIS — B009 Herpesviral infection, unspecified: Secondary | ICD-10-CM

## 2018-10-19 DIAGNOSIS — Z363 Encounter for antenatal screening for malformations: Secondary | ICD-10-CM | POA: Diagnosis not present

## 2018-10-19 DIAGNOSIS — Z3490 Encounter for supervision of normal pregnancy, unspecified, unspecified trimester: Secondary | ICD-10-CM

## 2018-12-01 ENCOUNTER — Ambulatory Visit (INDEPENDENT_AMBULATORY_CARE_PROVIDER_SITE_OTHER): Payer: BC Managed Care – PPO | Admitting: Obstetrics & Gynecology

## 2018-12-01 ENCOUNTER — Other Ambulatory Visit: Payer: BC Managed Care – PPO

## 2018-12-01 ENCOUNTER — Other Ambulatory Visit: Payer: Self-pay

## 2018-12-01 VITALS — BP 120/73 | HR 97 | Wt 149.0 lb

## 2018-12-01 DIAGNOSIS — Z3490 Encounter for supervision of normal pregnancy, unspecified, unspecified trimester: Secondary | ICD-10-CM

## 2018-12-01 DIAGNOSIS — Z3482 Encounter for supervision of other normal pregnancy, second trimester: Secondary | ICD-10-CM | POA: Diagnosis not present

## 2018-12-01 DIAGNOSIS — Z3A27 27 weeks gestation of pregnancy: Secondary | ICD-10-CM

## 2018-12-01 DIAGNOSIS — Z3492 Encounter for supervision of normal pregnancy, unspecified, second trimester: Secondary | ICD-10-CM

## 2018-12-01 NOTE — Patient Instructions (Signed)

## 2018-12-01 NOTE — Progress Notes (Signed)
   PRENATAL VISIT NOTE  Subjective:  Adriana Ross is a 28 y.o. G2P0010 at [redacted]w[redacted]d being seen today for ongoing prenatal care.  She is currently monitored for the following issues for this low-risk pregnancy and has Encounter for supervision of low-risk pregnancy, antepartum and HSV infection on their problem list.  Patient reports no complaints.  Contractions: Not present. Vag. Bleeding: None.  Movement: Present. Denies leaking of fluid.   The following portions of the patient's history were reviewed and updated as appropriate: allergies, current medications, past family history, past medical history, past social history, past surgical history and problem list.   Objective:   Vitals:   12/01/18 0948  BP: 120/73  Pulse: 97  Weight: 149 lb (67.6 kg)    Fetal Status: Fetal Heart Rate (bpm): 145 Fundal Height: 28 cm Movement: Present     General:  Alert, oriented and cooperative. Patient is in no acute distress.  Skin: Skin is warm and dry. No rash noted.   Cardiovascular: Normal heart rate noted  Respiratory: Normal respiratory effort, no problems with respiration noted  Abdomen: Soft, gravid, appropriate for gestational age.  Pain/Pressure: Absent     Pelvic: Cervical exam deferred        Extremities: Normal range of motion.     Mental Status: Normal mood and affect. Normal behavior. Normal judgment and thought content.   Assessment and Plan:  Pregnancy: G2P0010 at [redacted]w[redacted]d 1. Encounter for supervision of low-risk pregnancy, antepartum Routine testing - Genetic Screening - Glucose Tolerance, 2 Hours w/1 Hour - CBC - HIV Antibody (routine testing w rflx) - RPR  Preterm labor symptoms and general obstetric precautions including but not limited to vaginal bleeding, contractions, leaking of fluid and fetal movement were reviewed in detail with the patient. Please refer to After Visit Summary for other counseling recommendations.   Return in about 4 weeks (around 12/29/2018).  No  future appointments.  Scheryl Darter, MD

## 2018-12-02 LAB — CBC
Hematocrit: 33.9 % — ABNORMAL LOW (ref 34.0–46.6)
Hemoglobin: 11.2 g/dL (ref 11.1–15.9)
MCH: 30.9 pg (ref 26.6–33.0)
MCHC: 33 g/dL (ref 31.5–35.7)
MCV: 93 fL (ref 79–97)
Platelets: 145 10*3/uL — ABNORMAL LOW (ref 150–450)
RBC: 3.63 x10E6/uL — ABNORMAL LOW (ref 3.77–5.28)
RDW: 13.1 % (ref 11.7–15.4)
WBC: 11.4 10*3/uL — ABNORMAL HIGH (ref 3.4–10.8)

## 2018-12-02 LAB — HIV ANTIBODY (ROUTINE TESTING W REFLEX): HIV Screen 4th Generation wRfx: NONREACTIVE

## 2018-12-02 LAB — GLUCOSE TOLERANCE, 2 HOURS W/ 1HR
Glucose, 1 hour: 124 mg/dL (ref 65–179)
Glucose, 2 hour: 94 mg/dL (ref 65–152)
Glucose, Fasting: 76 mg/dL (ref 65–91)

## 2018-12-02 LAB — RPR: RPR Ser Ql: NONREACTIVE

## 2018-12-08 ENCOUNTER — Encounter: Payer: Self-pay | Admitting: Obstetrics & Gynecology

## 2018-12-09 ENCOUNTER — Encounter: Payer: Self-pay | Admitting: Obstetrics & Gynecology

## 2018-12-29 ENCOUNTER — Telehealth (INDEPENDENT_AMBULATORY_CARE_PROVIDER_SITE_OTHER): Payer: BC Managed Care – PPO | Admitting: Obstetrics & Gynecology

## 2018-12-29 DIAGNOSIS — Z3490 Encounter for supervision of normal pregnancy, unspecified, unspecified trimester: Secondary | ICD-10-CM

## 2018-12-29 DIAGNOSIS — Z3493 Encounter for supervision of normal pregnancy, unspecified, third trimester: Secondary | ICD-10-CM

## 2018-12-29 DIAGNOSIS — Z3A31 31 weeks gestation of pregnancy: Secondary | ICD-10-CM

## 2018-12-29 NOTE — Progress Notes (Signed)
Pt states she is unable to take BP at time of visit.

## 2018-12-29 NOTE — Patient Instructions (Signed)

## 2018-12-29 NOTE — Progress Notes (Signed)
   TELEHEALTH VIRTUAL OBSTETRICS VISIT ENCOUNTER NOTE  I connected with Tawni Carnes on 12/29/18 at  9:30 AM EDT by telephone at home and verified that I am speaking with the correct person using two identifiers.   I discussed the limitations, risks, security and privacy concerns of performing an evaluation and management service by telephone and the availability of in person appointments. I also discussed with the patient that there may be a patient responsible charge related to this service. The patient expressed understanding and agreed to proceed.  Subjective:  Adriana Ross is a 28 y.o. G2P0010 at [redacted]w[redacted]d being followed for ongoing prenatal care.  She is currently monitored for the following issues for this low-risk pregnancy and has Encounter for supervision of low-risk pregnancy, antepartum and HSV infection on their problem list.  Patient reports no complaints. Reports fetal movement. Denies any contractions, bleeding or leaking of fluid.   The following portions of the patient's history were reviewed and updated as appropriate: allergies, current medications, past family history, past medical history, past social history, past surgical history and problem list.   Objective:   General:  Alert, oriented and cooperative.   Mental Status: Normal mood and affect perceived. Normal judgment and thought content.  Rest of physical exam deferred due to type of encounter  Assessment and Plan:  Pregnancy: G2P0010 at [redacted]w[redacted]d 1. Encounter for supervision of low-risk pregnancy, antepartum bS data shows nl BP  Preterm labor symptoms and general obstetric precautions including but not limited to vaginal bleeding, contractions, leaking of fluid and fetal movement were reviewed in detail with the patient.  I discussed the assessment and treatment plan with the patient. The patient was provided an opportunity to ask questions and all were answered. The patient agreed with the plan and demonstrated an  understanding of the instructions. The patient was advised to call back or seek an in-person office evaluation/go to [facility]U at [facility] for any urgent or concerning symptoms. Please refer to After Visit Summary for other counseling recommendations.   I provided 10 minutes of non-face-to-face time during this encounter.  Return in about 1 month (around 02/01/2019) for 36 week visit in person.  No future appointments.  Emeterio Reeve, MD Center for [place], [place]

## 2019-01-06 ENCOUNTER — Telehealth: Payer: Self-pay

## 2019-01-06 NOTE — Telephone Encounter (Signed)
Error

## 2019-01-10 ENCOUNTER — Telehealth: Payer: Self-pay | Admitting: *Deleted

## 2019-01-10 NOTE — Telephone Encounter (Signed)
Received call regarding pt BP reading, last BP was 137/97.  Attempt to contact pt to discuss. LM on VM for pt to retake BP and contact office.

## 2019-01-17 ENCOUNTER — Telehealth (INDEPENDENT_AMBULATORY_CARE_PROVIDER_SITE_OTHER): Payer: BC Managed Care – PPO | Admitting: Obstetrics and Gynecology

## 2019-01-17 ENCOUNTER — Encounter: Payer: Self-pay | Admitting: Obstetrics and Gynecology

## 2019-01-17 VITALS — BP 132/84 | HR 103 | Wt 160.0 lb

## 2019-01-17 DIAGNOSIS — O98313 Other infections with a predominantly sexual mode of transmission complicating pregnancy, third trimester: Secondary | ICD-10-CM

## 2019-01-17 DIAGNOSIS — B009 Herpesviral infection, unspecified: Secondary | ICD-10-CM

## 2019-01-17 DIAGNOSIS — Z3A34 34 weeks gestation of pregnancy: Secondary | ICD-10-CM

## 2019-01-17 DIAGNOSIS — Z3490 Encounter for supervision of normal pregnancy, unspecified, unspecified trimester: Secondary | ICD-10-CM

## 2019-01-17 NOTE — Progress Notes (Signed)
I connected with Adriana Ross on 01/17/19 at 10:30 AM EDT by telephone and verified that I am speaking with the correct person using two identifiers.

## 2019-01-17 NOTE — Progress Notes (Signed)
   TELEHEALTH OBSTETRICS PRENATAL VIRTUAL VIDEO VISIT ENCOUNTER NOTE  Provider location: Center for [place] at [employer]   I connected with Adriana Ross on 01/17/19 at 10:30 AM EDT by MyChart Video Encounter at home and verified that I am speaking with the correct person using two identifiers.   I discussed the limitations, risks, security and privacy concerns of performing an evaluation and management service virtually and the availability of in person appointments. I also discussed with the patient that there may be a patient responsible charge related to this service. The patient expressed understanding and agreed to proceed. Subjective:  Adriana Ross is a 28 y.o. G2P0010 at [redacted]w[redacted]d being seen today for ongoing prenatal care.  She is currently monitored for the following issues for this low-risk pregnancy and has Encounter for supervision of low-risk pregnancy, antepartum and HSV infection on their problem list.  Patient reports no complaints.  Contractions: Not present. Vag. Bleeding: None.  Movement: Present. Denies any leaking of fluid.   The following portions of the patient's history were reviewed and updated as appropriate: allergies, current medications, past family history, past medical history, past social history, past surgical history and problem list.   Objective:   Vitals:   01/17/19 1030 01/17/19 1037  BP: (!) 146/88 132/84  Pulse: (!) 103   Weight: 160 lb (72.6 kg)     Fetal Status:     Movement: Present     General:  Alert, oriented and cooperative. Patient is in no acute distress.  Respiratory: Normal respiratory effort, no problems with respiration noted  Mental Status: Normal mood and affect. Normal behavior. Normal judgment and thought content.  Rest of physical exam deferred due to type of encounter  Imaging: No results found.  Assessment and Plan:  Pregnancy: G2P0010 at [redacted]w[redacted]d 1. Encounter for supervision of low-risk pregnancy, antepartum Patient  is doing well Elevated BP today  2. HSV infection Prophylaxis starting at 36 weeks  Preterm labor symptoms and general obstetric precautions including but not limited to vaginal bleeding, contractions, leaking of fluid and fetal movement were reviewed in detail with the patient. I discussed the assessment and treatment plan with the patient. The patient was provided an opportunity to ask questions and all were answered. The patient agreed with the plan and demonstrated an understanding of the instructions. The patient was advised to call back or seek an in-person office evaluation/go to MAU at [facility] for any urgent or concerning symptoms. Please refer to After Visit Summary for other counseling recommendations.   I provided 15 minutes of face-to-face time during this encounter.  Return in about 2 weeks (around 01/31/2019) for IN PERSON, ROB/GBS.  Future Appointments  Date Time Provider [place]  02/02/2019  8:30 AM Woodroe Mode, MD [facility] None    Mora Bellman, MD Center for [facility], [place]

## 2019-02-02 ENCOUNTER — Other Ambulatory Visit (HOSPITAL_COMMUNITY)
Admission: RE | Admit: 2019-02-02 | Discharge: 2019-02-02 | Disposition: A | Discharge status: Home / Self Care | Payer: Managed Care, Other (non HMO) | Source: Ambulatory Visit | Attending: Obstetrics & Gynecology | Admitting: Obstetrics & Gynecology

## 2019-02-02 ENCOUNTER — Ambulatory Visit (INDEPENDENT_AMBULATORY_CARE_PROVIDER_SITE_OTHER): Payer: BC Managed Care – PPO | Admitting: Obstetrics & Gynecology

## 2019-02-02 ENCOUNTER — Other Ambulatory Visit: Payer: Self-pay

## 2019-02-02 VITALS — BP 125/80 | HR 93 | Temp 97.8°F | Wt 164.0 lb

## 2019-02-02 DIAGNOSIS — Z3490 Encounter for supervision of normal pregnancy, unspecified, unspecified trimester: Secondary | ICD-10-CM | POA: Insufficient documentation

## 2019-02-02 DIAGNOSIS — Z3480 Encounter for supervision of other normal pregnancy, unspecified trimester: Secondary | ICD-10-CM

## 2019-02-02 DIAGNOSIS — Z3493 Encounter for supervision of normal pregnancy, unspecified, third trimester: Secondary | ICD-10-CM

## 2019-02-02 DIAGNOSIS — Z3A36 36 weeks gestation of pregnancy: Secondary | ICD-10-CM

## 2019-02-02 DIAGNOSIS — B009 Herpesviral infection, unspecified: Secondary | ICD-10-CM

## 2019-02-02 MED ORDER — VALACYCLOVIR HCL 1 G PO TABS: 1000.0000 mg | ORAL_TABLET | Freq: Every day | ORAL | 2 refills | Status: AC

## 2019-02-02 NOTE — Progress Notes (Signed)
CC: None  

## 2019-02-02 NOTE — Patient Instructions (Signed)

## 2019-02-02 NOTE — Progress Notes (Signed)
   PRENATAL VISIT NOTE  Subjective:  Adriana Ross is a 28 y.o. G2P0010 at [redacted]w[redacted]d being seen today for ongoing prenatal care.  She is currently monitored for the following issues for this low-risk pregnancy and has Encounter for supervision of low-risk pregnancy, antepartum and HSV infection on their problem list.  Patient reports no complaints.  Contractions: Not present. Vag. Bleeding: None.  Movement: Present. Denies leaking of fluid.   The following portions of the patient's history were reviewed and updated as appropriate: allergies, current medications, past family history, past medical history, past social history, past surgical history and problem list.   Objective:   Vitals:   02/02/19 0833  BP: 125/80  Pulse: 93  Temp: 97.8 F (36.6 C)  Weight: 164 lb (74.4 kg)    Fetal Status: Fetal Heart Rate (bpm): 150   Movement: Present     General:  Alert, oriented and cooperative. Patient is in no acute distress.  Skin: Skin is warm and dry. No rash noted.   Cardiovascular: Normal heart rate noted  Respiratory: Normal respiratory effort, no problems with respiration noted  Abdomen: Soft, gravid, appropriate for gestational age.  Pain/Pressure: Absent     Pelvic: Cervical exam performed        Extremities: Normal range of motion.  Edema: None  Mental Status: Normal mood and affect. Normal behavior. Normal judgment and thought content.   Assessment and Plan:  Pregnancy: G2P0010 at [redacted]w[redacted]d 1. Encounter for supervision of low-risk pregnancy, antepartum Routine 36 week labs - Strep Gp B NAA - Cervicovaginal ancillary only( [place])  2. HSV infection Valtrex Rx sent - valACYclovir (VALTREX) 1000 MG tablet; Take 1 tablet (1,000 mg total) by mouth daily.  Dispense: 30 tablet; Refill: 2  Preterm labor symptoms and general obstetric precautions including but not limited to vaginal bleeding, contractions, leaking of fluid and fetal movement were reviewed in detail with the patient.  Please refer to After Visit Summary for other counseling recommendations.   Return in about 1 week (around 02/09/2019) for virtual.  No future appointments.  Emeterio Reeve, MD

## 2019-02-04 LAB — STREP GP B NAA: Strep Gp B NAA: NEGATIVE

## 2019-02-07 LAB — CERVICOVAGINAL ANCILLARY ONLY
Bacterial vaginitis: NEGATIVE
Candida vaginitis: NEGATIVE
Chlamydia: NEGATIVE
Neisseria Gonorrhea: NEGATIVE
Trichomonas: NEGATIVE

## 2019-02-09 ENCOUNTER — Telehealth (INDEPENDENT_AMBULATORY_CARE_PROVIDER_SITE_OTHER): Payer: BC Managed Care – PPO | Admitting: Obstetrics

## 2019-02-09 ENCOUNTER — Encounter: Payer: Self-pay | Admitting: Obstetrics

## 2019-02-09 ENCOUNTER — Other Ambulatory Visit: Payer: Self-pay

## 2019-02-09 VITALS — BP 139/82 | HR 106 | Wt 164.0 lb

## 2019-02-09 DIAGNOSIS — B009 Herpesviral infection, unspecified: Secondary | ICD-10-CM

## 2019-02-09 DIAGNOSIS — Z3493 Encounter for supervision of normal pregnancy, unspecified, third trimester: Secondary | ICD-10-CM

## 2019-02-09 DIAGNOSIS — Z3490 Encounter for supervision of normal pregnancy, unspecified, unspecified trimester: Secondary | ICD-10-CM

## 2019-02-09 DIAGNOSIS — Z3A37 37 weeks gestation of pregnancy: Secondary | ICD-10-CM

## 2019-02-09 MED ORDER — PRENATE MINI 29-0.6-0.4-350 MG PO CAPS: 1.0000 | ORAL_CAPSULE | Freq: Every day | ORAL | 4 refills | Status: DC

## 2019-02-09 NOTE — Progress Notes (Signed)
I connected with Adriana Ross on 02/09/19 at  9:15 AM EDT by telephone and verified that I am speaking with the correct person using two identifiers.   No complaints today per pt. Needs PNV rf's.

## 2019-02-09 NOTE — Progress Notes (Signed)
   TELEHEALTH OBSTETRICS PRENATAL VIRTUAL VIDEO VISIT ENCOUNTER NOTE  Provider location: Center for [place] at [place]   I connected with Adriana Ross on 02/09/19 at  9:15 AM EDT by WebEx OB MyChart Video Encounter at home and verified that I am speaking with the correct person using two identifiers.   I discussed the limitations, risks, security and privacy concerns of performing an evaluation and management service virtually and the availability of in person appointments. I also discussed with the patient that there may be a patient responsible charge related to this service. The patient expressed understanding and agreed to proceed. Subjective:  Adriana Ross is a 28 y.o. G2P0010 at [redacted]w[redacted]d being seen today for ongoing prenatal care.  She is currently monitored for the following issues for this low-risk pregnancy and has Encounter for supervision of low-risk pregnancy, antepartum and HSV infection on their problem list.  Patient reports heartburn.  Contractions: Not present. Vag. Bleeding: None.  Movement: Present. Denies any leaking of fluid.   The following portions of the patient's history were reviewed and updated as appropriate: allergies, current medications, past family history, past medical history, past social history, past surgical history and problem list.   Objective:   Vitals:   02/09/19 0914  BP: 139/82  Pulse: (!) 106  Weight: 164 lb (74.4 kg)    Fetal Status:     Movement: Present     General:  Alert, oriented and cooperative. Patient is in no acute distress.  Respiratory: Normal respiratory effort, no problems with respiration noted  Mental Status: Normal mood and affect. Normal behavior. Normal judgment and thought content.  Rest of physical exam deferred due to type of encounter  Imaging: No results found.  Assessment and Plan:  Pregnancy: G2P0010 at [redacted]w[redacted]d 1. Encounter for supervision of low-risk pregnancy, antepartum Rx: - Prenat w/o  A-FeCbn-Meth-FA-DHA (PRENATE MINI) 29-0.6-0.4-350 MG CAPS; Take 1 capsule by mouth daily before breakfast.  Dispense: 90 capsule; Refill: 4  2. HSV infection - taking Valtrex for suppression  Term labor symptoms and general obstetric precautions including but not limited to vaginal bleeding, contractions, leaking of fluid and fetal movement were reviewed in detail with the patient. I discussed the assessment and treatment plan with the patient. The patient was provided an opportunity to ask questions and all were answered. The patient agreed with the plan and demonstrated an understanding of the instructions. The patient was advised to call back or seek an in-person office evaluation/go to MAU at [facility] for any urgent or concerning symptoms. Please refer to After Visit Summary for other counseling recommendations.   I provided 10 minutes of face-to-face time during this encounter.  Return in about 1 week (around 02/16/2019) for MyChart.    Baltazar Najjar, MD Center for [facility], [place] [facility] 02-09-2019

## 2019-02-10 ENCOUNTER — Encounter (HOSPITAL_COMMUNITY): Payer: Self-pay

## 2019-02-16 ENCOUNTER — Telehealth (INDEPENDENT_AMBULATORY_CARE_PROVIDER_SITE_OTHER): Payer: BC Managed Care – PPO | Admitting: Obstetrics

## 2019-02-16 ENCOUNTER — Encounter: Payer: Self-pay | Admitting: Obstetrics

## 2019-02-16 DIAGNOSIS — Z3493 Encounter for supervision of normal pregnancy, unspecified, third trimester: Secondary | ICD-10-CM

## 2019-02-16 DIAGNOSIS — Z3490 Encounter for supervision of normal pregnancy, unspecified, unspecified trimester: Secondary | ICD-10-CM

## 2019-02-16 DIAGNOSIS — Z3A38 38 weeks gestation of pregnancy: Secondary | ICD-10-CM

## 2019-02-16 NOTE — Progress Notes (Signed)
   TELEHEALTH OBSTETRICS PRENATAL VIRTUAL VIDEO VISIT ENCOUNTER NOTE  Provider location: Center for [place] at [place]   I connected with Adriana Ross on 02/16/19 at  9:45 AM EDT by WebEx OB MyChart Video Encounter at home and verified that I am speaking with the correct person using two identifiers.   I discussed the limitations, risks, security and privacy concerns of performing an evaluation and management service virtually and the availability of in person appointments. I also discussed with the patient that there may be a patient responsible charge related to this service. The patient expressed understanding and agreed to proceed. Subjective:  Adriana Ross is a 28 y.o. G2P0010 at [redacted]w[redacted]d being seen today for ongoing prenatal care.  She is currently monitored for the following issues for this low-risk pregnancy and has Encounter for supervision of low-risk pregnancy, antepartum and HSV infection on their problem list.  Patient reports no complaints.  Contractions: Not present. Vag. Bleeding: None.  Movement: Present. Denies any leaking of fluid.   The following portions of the patient's history were reviewed and updated as appropriate: allergies, current medications, past family history, past medical history, past social history, past surgical history and problem list.   Objective:   Vitals:   02/16/19 1002  BP: 139/83  Pulse: (!) 103    Fetal Status:     Movement: Present     General:  Alert, oriented and cooperative. Patient is in no acute distress.  Respiratory: Normal respiratory effort, no problems with respiration noted  Mental Status: Normal mood and affect. Normal behavior. Normal judgment and thought content.  Rest of physical exam deferred due to type of encounter  Imaging: No results found.  Assessment and Plan:  Pregnancy: G2P0010 at [redacted]w[redacted]d 1. Encounter for supervision of low-risk pregnancy, antepartum   Term labor symptoms and general obstetric  precautions including but not limited to vaginal bleeding, contractions, leaking of fluid and fetal movement were reviewed in detail with the patient. I discussed the assessment and treatment plan with the patient. The patient was provided an opportunity to ask questions and all were answered. The patient agreed with the plan and demonstrated an understanding of the instructions. The patient was advised to call back or seek an in-person office evaluation/go to MAU at [facility] for any urgent or concerning symptoms. Please refer to After Visit Summary for other counseling recommendations.   I provided 10 minutes of face-to-face time during this encounter.  Return in about 1 week (around 02/23/2019) for ROB in person.   Adriana Najjar, MD Center for [place], [place]

## 2019-02-23 ENCOUNTER — Ambulatory Visit (INDEPENDENT_AMBULATORY_CARE_PROVIDER_SITE_OTHER): Payer: BC Managed Care – PPO | Admitting: Certified Nurse Midwife

## 2019-02-23 ENCOUNTER — Encounter (HOSPITAL_COMMUNITY): Payer: Self-pay | Admitting: *Deleted

## 2019-02-23 ENCOUNTER — Inpatient Hospital Stay (HOSPITAL_COMMUNITY)
Admission: RE | Admit: 2019-02-23 | Discharge: 2019-02-28 | DRG: 786 | Disposition: A | Discharge status: Home / Self Care | Payer: Managed Care, Other (non HMO) | Attending: Obstetrics and Gynecology | Admitting: Obstetrics and Gynecology

## 2019-02-23 ENCOUNTER — Other Ambulatory Visit: Payer: Self-pay

## 2019-02-23 ENCOUNTER — Encounter: Payer: Self-pay | Admitting: Certified Nurse Midwife

## 2019-02-23 VITALS — BP 146/80 | HR 101 | Wt 171.0 lb

## 2019-02-23 DIAGNOSIS — O133 Gestational [pregnancy-induced] hypertension without significant proteinuria, third trimester: Secondary | ICD-10-CM | POA: Diagnosis not present

## 2019-02-23 DIAGNOSIS — D696 Thrombocytopenia, unspecified: Secondary | ICD-10-CM | POA: Diagnosis present

## 2019-02-23 DIAGNOSIS — O411230 Chorioamnionitis, third trimester, not applicable or unspecified: Secondary | ICD-10-CM | POA: Diagnosis present

## 2019-02-23 DIAGNOSIS — A6000 Herpesviral infection of urogenital system, unspecified: Secondary | ICD-10-CM | POA: Diagnosis not present

## 2019-02-23 DIAGNOSIS — Z3A39 39 weeks gestation of pregnancy: Secondary | ICD-10-CM

## 2019-02-23 DIAGNOSIS — B009 Herpesviral infection, unspecified: Secondary | ICD-10-CM | POA: Diagnosis present

## 2019-02-23 DIAGNOSIS — O9912 Other diseases of the blood and blood-forming organs and certain disorders involving the immune mechanism complicating childbirth: Secondary | ICD-10-CM | POA: Diagnosis present

## 2019-02-23 DIAGNOSIS — O9832 Other infections with a predominantly sexual mode of transmission complicating childbirth: Secondary | ICD-10-CM | POA: Diagnosis not present

## 2019-02-23 DIAGNOSIS — O411290 Chorioamnionitis, unspecified trimester, not applicable or unspecified: Secondary | ICD-10-CM

## 2019-02-23 DIAGNOSIS — Z20828 Contact with and (suspected) exposure to other viral communicable diseases: Secondary | ICD-10-CM | POA: Diagnosis present

## 2019-02-23 DIAGNOSIS — O139 Gestational [pregnancy-induced] hypertension without significant proteinuria, unspecified trimester: Secondary | ICD-10-CM

## 2019-02-23 DIAGNOSIS — O24429 Gestational diabetes mellitus in childbirth, unspecified control: Secondary | ICD-10-CM | POA: Diagnosis not present

## 2019-02-23 DIAGNOSIS — O134 Gestational [pregnancy-induced] hypertension without significant proteinuria, complicating childbirth: Secondary | ICD-10-CM | POA: Diagnosis not present

## 2019-02-23 DIAGNOSIS — Z3490 Encounter for supervision of normal pregnancy, unspecified, unspecified trimester: Secondary | ICD-10-CM

## 2019-02-23 DIAGNOSIS — Z87891 Personal history of nicotine dependence: Secondary | ICD-10-CM | POA: Diagnosis not present

## 2019-02-23 DIAGNOSIS — O98313 Other infections with a predominantly sexual mode of transmission complicating pregnancy, third trimester: Secondary | ICD-10-CM

## 2019-02-23 LAB — COMPREHENSIVE METABOLIC PANEL
ALT: 16 U/L (ref 0–44)
AST: 23 U/L (ref 15–41)
Albumin: 3.1 g/dL — ABNORMAL LOW (ref 3.5–5.0)
Alkaline Phosphatase: 157 U/L — ABNORMAL HIGH (ref 38–126)
Anion gap: 14 (ref 5–15)
BUN: 6 mg/dL (ref 6–20)
CO2: 17 mmol/L — ABNORMAL LOW (ref 22–32)
Calcium: 9.2 mg/dL (ref 8.9–10.3)
Chloride: 108 mmol/L (ref 98–111)
Creatinine, Ser: 0.6 mg/dL (ref 0.44–1.00)
GFR calc Af Amer: >60 mL/min (ref >60)
GFR calc non Af Amer: >60 mL/min (ref >60)
Glucose, Bld: 97 mg/dL (ref 70–99)
Potassium: 3.5 mmol/L (ref 3.5–5.1)
Sodium: 139 mmol/L (ref 135–145)
Total Bilirubin: 0.4 mg/dL (ref 0.3–1.2)
Total Protein: 6.7 g/dL (ref 6.5–8.1)

## 2019-02-23 LAB — CBC
HCT: 33.5 % — ABNORMAL LOW (ref 36.0–46.0)
Hemoglobin: 11.7 g/dL — ABNORMAL LOW (ref 12.0–15.0)
MCH: 31.1 pg (ref 26.0–34.0)
MCHC: 34.9 g/dL (ref 30.0–36.0)
MCV: 89.1 fL (ref 80.0–100.0)
Platelets: 146 10*3/uL — ABNORMAL LOW (ref 150–400)
RBC: 3.76 MIL/uL — ABNORMAL LOW (ref 3.87–5.11)
RDW: 12.5 % (ref 11.5–15.5)
WBC: 11.5 10*3/uL — ABNORMAL HIGH (ref 4.0–10.5)
nRBC: 0 % (ref 0.0–0.2)

## 2019-02-23 LAB — TYPE AND SCREEN
ABO/RH(D): O POS
Antibody Screen: NEGATIVE

## 2019-02-23 LAB — ABO/RH: ABO/RH(D): O POS

## 2019-02-23 LAB — PROTEIN / CREATININE RATIO, URINE
Creatinine, Urine: 113.17 mg/dL
Protein Creatinine Ratio: 0.15 mg/mg{Cre} (ref 0.00–0.15)
Total Protein, Urine: 17 mg/dL

## 2019-02-23 LAB — SARS CORONAVIRUS 2 (TAT 6-24 HRS): SARS Coronavirus 2: NEGATIVE

## 2019-02-23 MED ORDER — ONDANSETRON HCL 4 MG/2ML IJ SOLN: 4.0000 mg | Freq: Four times a day (QID) | INTRAMUSCULAR | Status: DC | PRN

## 2019-02-23 MED ORDER — OXYCODONE-ACETAMINOPHEN 5-325 MG PO TABS: 2.0000 | ORAL_TABLET | ORAL | Status: DC | PRN

## 2019-02-23 MED ORDER — OXYTOCIN BOLUS FROM INFUSION: 500.0000 mL | Freq: Once | INTRAVENOUS | Status: DC

## 2019-02-23 MED ORDER — OXYCODONE-ACETAMINOPHEN 5-325 MG PO TABS: 1.0000 | ORAL_TABLET | ORAL | Status: DC | PRN

## 2019-02-23 MED ORDER — LACTATED RINGERS IV SOLN
500.0000 mL | INTRAVENOUS | Status: DC | PRN
  Administered 2019-02-24: 500 mL via INTRAVENOUS

## 2019-02-23 MED ORDER — LIDOCAINE HCL (PF) 1 % IJ SOLN: 30.0000 mL | INTRAMUSCULAR | Status: DC | PRN

## 2019-02-23 MED ORDER — MISOPROSTOL 50MCG HALF TABLET
50.0000 ug | ORAL_TABLET | ORAL | Status: DC
  Administered 2019-02-23 – 2019-02-24 (×4): 50 ug via BUCCAL
  Filled 2019-02-23 (×5): qty 1

## 2019-02-23 MED ORDER — FENTANYL CITRATE (PF) 100 MCG/2ML IJ SOLN
100.0000 ug | INTRAMUSCULAR | Status: DC | PRN
  Administered 2019-02-24 – 2019-02-25 (×4): 100 ug via INTRAVENOUS
  Filled 2019-02-23 (×4): qty 2

## 2019-02-23 MED ORDER — OXYTOCIN 40 UNITS IN NORMAL SALINE INFUSION - SIMPLE MED: 2.5000 [IU]/h | INTRAVENOUS | Status: DC

## 2019-02-23 MED ORDER — LACTATED RINGERS IV SOLN
INTRAVENOUS | Status: DC
  Administered 2019-02-23 – 2019-02-25 (×6): via INTRAVENOUS

## 2019-02-23 MED ORDER — SOD CITRATE-CITRIC ACID 500-334 MG/5ML PO SOLN
30.0000 mL | ORAL | Status: DC | PRN
  Administered 2019-02-25: 30 mL via ORAL
  Filled 2019-02-23: qty 30

## 2019-02-23 MED ORDER — ACETAMINOPHEN 325 MG PO TABS
650.0000 mg | ORAL_TABLET | ORAL | Status: DC | PRN
  Administered 2019-02-25 (×2): 650 mg via ORAL
  Filled 2019-02-23 (×2): qty 2

## 2019-02-23 NOTE — Progress Notes (Signed)
Adriana Ross is a 28 y.o. female G2P0010 with IUP at [redacted]w[redacted]d by LMP c/w 21 wk [place] presenting for IOL for [place].   Subjective: Doing well. Feeling mild contractions   Objective: BP 138/77   Pulse 92   Temp 98.2 F (36.8 C) (Oral)   Resp 17   Ht 5\' 2"  (1.575 m)   Wt 77.1 kg   LMP 05/23/2018   BMI 31.09 kg/m  No intake/output data recorded. No intake/output data recorded.  FHT:  FHR: 140 bpm, variability: moderate,  accelerations:  Present,  decelerations:  Absent UC:   regular, every 1.5-4 minutes SVE:   Dilation: Fingertip Effacement (%): 60 Station: -3 Exam by:: Hansel Feinstein CNM  Labs: Lab Results  Component Value Date   WBC 11.5 (H) 02/23/2019   HGB 11.7 (L) 02/23/2019   HCT 33.5 (L) 02/23/2019   MCV 89.1 02/23/2019   PLT 146 (L) 02/23/2019    Assessment / Plan: Induction of labor secondary to GDM, progressing well. S/p cyto X 1 at 14:09  Labor: Progressing normally Preeclampsia:  no signs or symptoms of toxicity Fetal Wellbeing:  Category I Pain Control:  Labor support without medications I/D:  n/a Anticipated MOD:  anticipate NSVD, will progress to c-section if neccesary  [name] MD PGY-1, [place] [facility]  02/23/2019, 10:13 PM

## 2019-02-23 NOTE — Progress Notes (Signed)
Pt is here for ROB. [redacted]w[redacted]d. Pt's BP is elevated today, pt reports she has been having HA's occasionally- she has not tried to take any medication for the HA.

## 2019-02-23 NOTE — Progress Notes (Signed)
Patient ID: Adriana Ross, female   DOB: 1991/02/10, 28 y.o.   MRN: [id number] Vitals:   02/23/19 1815 02/23/19 1857 02/23/19 2030 02/23/19 2204  BP: (!) 144/92 (!) 147/86 (!) 144/83 138/77  Pulse: 90 88 86 92  Resp: 16 18 17    Temp:    98.2 F (36.8 C)  TempSrc:    Oral  Weight:      Height:       Attempted insertion of Foley Unable to advance past balloon edge  Will do one more dose of PO Cytotec

## 2019-02-23 NOTE — Progress Notes (Signed)
   PRENATAL VISIT NOTE  Subjective:  Adriana Ross is a 28 y.o. G2P0010 at [redacted]w[redacted]d being seen today for ongoing prenatal care.  She is currently monitored for the following issues for this low-risk pregnancy and has Encounter for supervision of low-risk pregnancy, antepartum and HSV infection on their problem list.  Patient reports headache.  Contractions: Not present. Vag. Bleeding: None.  Movement: Present. Denies leaking of fluid.   The following portions of the patient's history were reviewed and updated as appropriate: allergies, current medications, past family history, past medical history, past social history, past surgical history and problem list.   Objective:   Vitals:   02/23/19 0946 02/23/19 0949  BP: (!) 158/83 (!) 146/80  Pulse: 99 (!) 101  Weight: 171 lb (77.6 kg)     Fetal Status: Fetal Heart Rate (bpm): 145 Fundal Height: 37 cm Movement: Present  Presentation: Vertex  General:  Alert, oriented and cooperative. Patient is in no acute distress.  Skin: Skin is warm and dry. No rash noted.   Cardiovascular: Normal heart rate noted  Respiratory: Normal respiratory effort, no problems with respiration noted  Abdomen: Soft, gravid, appropriate for gestational age.  Pain/Pressure: Present     Pelvic: Cervical exam deferred        Extremities: Normal range of motion.  Edema: Trace  Mental Status: Normal mood and affect. Normal behavior. Normal judgment and thought content.   Assessment and Plan:  Pregnancy: G2P0010 at [redacted]w[redacted]d 1. Encounter for supervision of low-risk pregnancy, antepartum - COVID 19 precautions  - Reviewed safety, visitor policy, reassurance about COVID-19 for pregnancy at this time. Discussed possible changes to visits, including televisits, that may occur due to COVID-19.  The office remains open if pt needs to be seen and MAU is open 24 hours/day for OB emergencies.  2. HSV infection - currently on suppression, no active outbreaks or recent outbreaks   3. Gestational hypertension, third trimester - BP elevated today in the office, BP 146/80 and 158/83 - Patient reports HA that has been present since yesterday, went away on own then returned today, has not taken any medication for HA  - Denies vision changes or RUQ pain  - Elevated BP around 34 weeks, based on previous elevated BP and today BP diagnosed with GHTN  - Patient sent to hospital as direct admit for [facility]  - Educated and discussed IOL with patient, patient plans to go home to get bag and partner then head to hospital  - Labor team notified and Cat Juleen China aware of direct admit    Term labor symptoms and general obstetric precautions including but not limited to vaginal bleeding, contractions, leaking of fluid and fetal movement were reviewed in detail with the patient. Please refer to After Visit Summary for other counseling recommendations.   Return in about 5 weeks (around 03/30/2019) for POSTPARTUM.  Lajean Manes, CNM

## 2019-02-23 NOTE — MAU Note (Signed)
Covid swab collected. Pt tolerated well. Pt asymptomatic 

## 2019-02-23 NOTE — H&P (Signed)
LABOR AND DELIVERY ADMISSION HISTORY AND PHYSICAL NOTE  Adriana Ross is a 28 y.o. female G2P0010 with IUP at [redacted]w[redacted]d by LMP c/w 21 wk [place] presenting for IOL for [place].  She reports positive fetal movement. She denies leakage of fluid or vaginal bleeding.  She plans on breast feeding. She requests OCP's for birth control.  Had mild range BP 146/88 on 01/17/2019 Today in clinic had multiple mild range BP's 158/83 and 146/80   Headache: No Vision changes: No Chest pain: No SOB: No RUQ pain: No Leg swelling: No   Prenatal History/Complications: PNC at [facility]:  @[redacted]w[redacted]d , CWD, normal anatomy (isolated EICF), cephalic presentation, 093G, 18%EXH Pregnancy complications:  - gHTN - HSV  Past Medical History: History reviewed. No pertinent past medical history.  Past Surgical History: History reviewed. No pertinent surgical history.  Obstetrical History: OB History    Gravida  2   Para      Term      Preterm      AB  1   Living  0     SAB      TAB  1   Ectopic      Multiple      Live Births              Social History: Social History   Socioeconomic History  . Marital status: Single    Spouse name: Not on file  . Number of children: Not on file  . Years of education: Not on file  . Highest education level: Not on file  Occupational History  . Not on file  Social Needs  . Financial resource strain: Not on file  . Food insecurity    Worry: Not on file    Inability: Not on file  . Transportation needs    Medical: Not on file    Non-medical: Not on file  Tobacco Use  . Smoking status: Former Research scientist (life sciences)  . Smokeless tobacco: Never Used  Substance and Sexual Activity  . Alcohol use: Yes  . Drug use: Yes    Types: Marijuana  . Sexual activity: Yes    Birth control/protection: None  Lifestyle  . Physical activity    Days per week: Not on file    Minutes per session: Not on file  . Stress: Not on file  Relationships  . Social Product manager on phone: Not on file    Gets together: Not on file    Attends religious service: Not on file    Active member of club or organization: Not on file    Attends meetings of clubs or organizations: Not on file    Relationship status: Not on file  Other Topics Concern  . Not on file  Social History Narrative  . Not on file    Family History: Family History  Problem Relation Age of Onset  . Cancer Maternal Grandmother     Allergies: No Known Allergies  Medications Prior to Admission  Medication Sig Dispense Refill Last Dose  . ferrous sulfate 325 (65 FE) MG tablet Take 325 mg by mouth daily with breakfast.     . Prenat w/o A-FeCbn-Meth-FA-DHA (PRENATE MINI) 29-0.6-0.4-350 MG CAPS Take 1 capsule by mouth daily before breakfast. 90 capsule 4   . Prenat-Fe Carbonyl-FA-Omega 3 (ONE-A-DAY WOMENS PRENATAL 1 PO) Take by mouth.     . terconazole (TERAZOL 7) 0.4 % vaginal cream Place 1 applicator vaginally at bedtime. (Patient not taking: Reported on 10/05/2018)  45 g 0   . valACYclovir (VALTREX) 1000 MG tablet Take 1 tablet (1,000 mg total) by mouth daily. 30 tablet 2      Review of Systems  All systems reviewed and negative except as stated in HPI  Physical Exam Blood pressure (!) 137/98, pulse (!) 102, temperature 98.7 F (37.1 C), temperature source Oral, resp. rate 16, height 5\' 2"  (1.575 m), weight 77.1 kg, last menstrual period 05/23/2018. General appearance: alert, oriented, NAD Lungs: normal respiratory effort Heart: regular rate Abdomen: soft, non-tender; gravid Extremities: No calf swelling or tenderness Presentation: cephalic by exam Fetal monitoringBaseline: 150 bpm, Variability: Good {> 6 bpm), Accelerations: Reactive and Decelerations: Absent Uterine activityFrequency: Every 4-5 minutes Dilation: Closed Station: -3 Exam by:: Dr Crissie Reese  Prenatal labs: ABO, Rh: --/--/O POS (08/26 1202) O+ (scanned prenatal records) Antibody: NEG (08/26 1202) neg (scanned prenatal  records) Rubella:   Immune (Scanned prenatal records RPR: Non Reactive (06/03 1038)  HBsAg:   NR (scanned prenatal records HIV: Non Reactive (06/03 1038)  GC/Chlamydia: neg/neg 02/02/2019  GBS: Negative (08/05 0952)  2-hr GTT: normal 12/01/2018 Genetic screening:  Low risk Panorama Anatomy US: normal  Prenatal Transfer Tool  Maternal Diabetes: No Genetic Screening: Normal Maternal Ultrasounds/Referrals: Normal Fetal Ultrasounds or other Referrals:  None Maternal Substance Abuse:  No Significant Maternal Medications:  None Significant Maternal Lab Results: Group B Strep negative  Results for orders placed or performed during the hospital encounter of 02/23/19 (from the past 24 hour(s))  CBC   Collection Time: 02/23/19 12:02 PM  Result Value Ref Range   WBC 11.5 (H) 4.0 - 10.5 K/uL   RBC 3.76 (L) 3.87 - 5.11 MIL/uL   Hemoglobin 11.7 (L) 12.0 - 15.0 g/dL   HCT 25.4 (L) 27.0 - 62.3 %   MCV 89.1 80.0 - 100.0 fL   MCH 31.1 26.0 - 34.0 pg   MCHC 34.9 30.0 - 36.0 g/dL   RDW 76.2 83.1 - 51.7 %   Platelets 146 (L) 150 - 400 K/uL   nRBC 0.0 0.0 - 0.2 %  Protein / creatinine ratio, urine   Collection Time: 02/23/19 12:02 PM  Result Value Ref Range   Creatinine, Urine 113.17 mg/dL   Total Protein, Urine 17 mg/dL   Protein Creatinine Ratio 0.15 0.00 - 0.15 mg/mg[Cre]  Comprehensive metabolic panel   Collection Time: 02/23/19 12:02 PM  Result Value Ref Range   Sodium 139 135 - 145 mmol/L   Potassium 3.5 3.5 - 5.1 mmol/L   Chloride 108 98 - 111 mmol/L   CO2 17 (L) 22 - 32 mmol/L   Glucose, Bld 97 70 - 99 mg/dL   BUN 6 6 - 20 mg/dL   Creatinine, Ser 6.16 0.44 - 1.00 mg/dL   Calcium 9.2 8.9 - 07.3 mg/dL   Total Protein 6.7 6.5 - 8.1 g/dL   Albumin 3.1 (L) 3.5 - 5.0 g/dL   AST 23 15 - 41 U/L   ALT 16 0 - 44 U/L   Alkaline Phosphatase 157 (H) 38 - 126 U/L   Total Bilirubin 0.4 0.3 - 1.2 mg/dL   GFR calc non Af Amer >60 >60 mL/min   GFR calc Af Amer >60 >60 mL/min   Anion gap 14 5 -  15  Type and screen [facility]   Collection Time: 02/23/19 12:02 PM  Result Value Ref Range   ABO/RH(D) O POS    Antibody Screen NEG    Sample Expiration  02/26/2019,2359 Performed at [facility], [address] [place], [place] [address]     Patient Active Problem List   Diagnosis Date Noted  . Gestational hypertension 02/23/2019  . HSV infection 09/09/2018  . Encounter for supervision of low-risk pregnancy, antepartum 09/07/2018    Assessment: Adriana Ross is a 28 y.o. G2P0010 at 454w3d here for IOL for gHTN.   #gHTN: CBC, CMP, and UPCR are unremarkable, asymptomatic, monitor BPs.  #Labor: Cervix not yet favorable for FB, start IOL w miso #Pain: Open to epidural #FWB: Perior period of quiet, Cat II, likely sleep cycle, now entering more reassuring Cat I period #GBS/ID:  negative #COVID: swab pending #MOF: Breast #MOC: OCPs #Circ: yes #HSV: SSE shows no lesions, OK for VD #Vaccines: offer TDaP PP #Thrombocytopenia: very mild at 146, c/w check 2 months ago which was 145, OK for epidural, ctm  Venora MaplesMatthew M Dustan Hyams 02/23/2019, 2:27 PM

## 2019-02-24 LAB — RPR: RPR Ser Ql: NONREACTIVE

## 2019-02-24 MED ORDER — TERBUTALINE SULFATE 1 MG/ML IJ SOLN: 0.2500 mg | Freq: Once | INTRAMUSCULAR | Status: DC | PRN

## 2019-02-24 MED ORDER — OXYTOCIN 40 UNITS IN NORMAL SALINE INFUSION - SIMPLE MED
1.0000 m[IU]/min | INTRAVENOUS | Status: DC
  Administered 2019-02-24 – 2019-02-25 (×2): 2 m[IU]/min via INTRAVENOUS
  Filled 2019-02-24: qty 1000

## 2019-02-24 MED ORDER — TERBUTALINE SULFATE 1 MG/ML IJ SOLN
0.2500 mg | Freq: Once | INTRAMUSCULAR | Status: AC | PRN
  Administered 2019-02-25: 0.25 mg via SUBCUTANEOUS
  Filled 2019-02-24: qty 1

## 2019-02-24 NOTE — Progress Notes (Signed)
LABOR PROGRESS NOTE  Adriana Ross is a 28 y.o. G2P0010 at [redacted]w[redacted]d  admitted for IOL for gHTN.   Subjective: Foley came out  Objective: BP (!) 156/98   Pulse 95   Temp 98.4 F (36.9 C) (Oral)   Resp 17   Ht 5\' 2"  (1.575 m)   Wt 77.1 kg   LMP 05/23/2018   BMI 31.09 kg/m  or  Vitals:   02/24/19 1631 02/24/19 1709 02/24/19 1810 02/24/19 1927  BP: (!) 148/81 (!) 145/90 (!) 147/92 (!) 156/98  Pulse: 95 93 96 95  Resp: 18 18  17   Temp:      TempSrc:      Weight:      Height:         Dilation: 3.5 Effacement (%): 70 Cervical Position: Middle Station: Ballotable Presentation: Vertex Exam by:: Dr. Dione Plover FHT: baseline rate 135, moderate varibility, -acel, -decel Toco: q3 min  Labs: Lab Results  Component Value Date   WBC 11.5 (H) 02/23/2019   HGB 11.7 (L) 02/23/2019   HCT 33.5 (L) 02/23/2019   MCV 89.1 02/23/2019   PLT 146 (L) 02/23/2019    Patient Active Problem List   Diagnosis Date Noted  . Gestational hypertension 02/23/2019  . HSV infection 09/09/2018  . Encounter for supervision of low-risk pregnancy, antepartum 09/07/2018    Assessment / Plan: 28 y.o. G2P0010 at [redacted]w[redacted]d here for IOL for gHTN.  Labor: Miso x4, FB out just now and cervix 3-4cm/70/ballotable. Start pit.    Fetal Wellbeing:  Cat I, reassuring Pain Control:  Open to epidural Anticipated MOD:  SVD gHTN: PIH labs normal on admission, normal to mild range pressures since arrival.  Augustin Coupe, MD/MPH OB Fellow  02/24/2019, 7:38 PM

## 2019-02-24 NOTE — Progress Notes (Signed)
LABOR PROGRESS NOTE  Adriana Ross is a 28 y.o. G2P0010 at [redacted]w[redacted]d  admitted for IOL for gHTN.   Subjective: Feeling about the same  Objective: BP (!) 159/85   Pulse 99   Temp 98.5 F (36.9 C) (Axillary)   Resp 17   Ht 5\' 2"  (1.575 m)   Wt 77.1 kg   LMP 05/23/2018   BMI 31.09 kg/m  or  Vitals:   02/24/19 1105 02/24/19 1207 02/24/19 1257 02/24/19 1350  BP: 140/76 132/63 136/72 (!) 159/85  Pulse: 98 93 84 99  Resp: 17 16 16 17   Temp: 98.5 F (36.9 C)     TempSrc: Axillary     Weight:      Height:         Dilation: 1 Effacement (%): 40 Cervical Position: Middle Station: -3 Presentation: Vertex Exam by:: Dr. Dione Plover FHT: baseline rate 140, moderate varibility, +acel, -decel Toco: q3 min  Labs: Lab Results  Component Value Date   WBC 11.5 (H) 02/23/2019   HGB 11.7 (L) 02/23/2019   HCT 33.5 (L) 02/23/2019   MCV 89.1 02/23/2019   PLT 146 (L) 02/23/2019    Patient Active Problem List   Diagnosis Date Noted  . Gestational hypertension 02/23/2019  . HSV infection 09/09/2018  . Encounter for supervision of low-risk pregnancy, antepartum 09/07/2018    Assessment / Plan: 28 y.o. G2P0010 at [redacted]w[redacted]d here for IOL for gHTN.  Labor: Miso x4, able to successfuly place FB at 1410. Unable to miso further, consider pitocin.   Fetal Wellbeing:  Cat I, reassuring Pain Control:  Open to epidural Anticipated MOD:  SVD gHTN: PIH labs normal on admission, normal to mild range pressures since arrival.  Augustin Coupe, MD/MPH OB Fellow  02/24/2019, 2:32 PM

## 2019-02-24 NOTE — Progress Notes (Signed)
Enrique Weiss Harrisis a 28 y.o.femaleG2P0010 with IUP at [redacted]w[redacted]d by LMP c/w 21 wk USpresenting for IOL forPIH.   Subjective: Not feeling contractions as actively now.   Objective: BP 136/81   Pulse 92   Temp 98.2 F (36.8 C) (Oral)   Resp 18   Ht 5\' 2"  (1.575 m)   Wt 77.1 kg   LMP 05/23/2018   BMI 31.09 kg/m  No intake/output data recorded. No intake/output data recorded.  FHT:  FHR: 145 bpm, variability: moderate,  accelerations:  Present,  decelerations:  Absent UC:   regular, every 1-6 minutes SVE:   Dilation: 1 Effacement (%): 60 Station: -3 Exam by:: [place] RN  Labs: Lab Results  Component Value Date   WBC 11.5 (H) 02/23/2019   HGB 11.7 (L) 02/23/2019   HCT 33.5 (L) 02/23/2019   MCV 89.1 02/23/2019   PLT 146 (L) 02/23/2019    Assessment / Plan: Kelcey Korus Harrisis a 28 y.o.femaleG2P0010 with IUP at [redacted]w[redacted]d by LMP c/w 21 wk USpresenting for IOL forPIH. s/p 3 X cytotec. Pt declined foley baloon. Only 1cm dilated with 60% effacement. Will give another dose of cytotec.   Labor: Progressing normally Preeclampsia:  no signs or symptoms of toxicity Fetal Wellbeing:  Category I reassuring fetal tracing  Pain Control:  Labor support without medications pt would like epidural during active stage of labor.  I/D:  n/a Anticipated MOD:  anticipate NSVD. Will progress to c-section if necessary.  [name] MD PGY-1, [facility]  02/24/2019, 6:14 AM

## 2019-02-24 NOTE — Progress Notes (Signed)
LABOR PROGRESS NOTE  Adriana Ross is a 28 y.o. G2P0010 at [redacted]w[redacted]d  admitted for IOL for gHTN.   Subjective: Having uncomfortable contractions Open to FB placement with addition of IV pain meds  Objective: BP (!) 141/91   Pulse 90   Temp 98.4 F (36.9 C) (Oral)   Resp 18   Ht 5\' 2"  (1.575 m)   Wt 77.1 kg   LMP 05/23/2018   BMI 31.09 kg/m  or  Vitals:   02/24/19 0627 02/24/19 0730 02/24/19 0731 02/24/19 1003  BP: (!) 141/88  137/84 (!) 141/91  Pulse: 78  85 90  Resp: 18   18  Temp:  98.4 F (36.9 C)    TempSrc:  Oral    Weight:      Height:         Dilation: 1 Effacement (%): 40 Cervical Position: Middle Station: -3 Presentation: Vertex Exam by:: Dr. Dione Plover FHT: baseline rate 145, moderate varibility, +acel, -decel Toco: q2 min  Labs: Lab Results  Component Value Date   WBC 11.5 (H) 02/23/2019   HGB 11.7 (L) 02/23/2019   HCT 33.5 (L) 02/23/2019   MCV 89.1 02/23/2019   PLT 146 (L) 02/23/2019    Patient Active Problem List   Diagnosis Date Noted  . Gestational hypertension 02/23/2019  . HSV infection 09/09/2018  . Encounter for supervision of low-risk pregnancy, antepartum 09/07/2018    Assessment / Plan: 28 y.o. G2P0010 at [redacted]w[redacted]d here for IOL for gHTN.  Labor: Slow progress with miso x4, on my exam internal os unfortunately remains closed and still very thick. Discussed placement of FB with cook's so that we would be able to pass through internal os but given her discomfort with exams and interventions to date engaged in shared decision making and will attempt one more miso prior to attempting this, though her contractions will need to space more to make this feasible. If they do not space will attempt FB placement sooner.  Fetal Wellbeing:  Cat I, reassuring Pain Control:  Open to epidural Anticipated MOD:  SVD gHTN: PIH labs normal on admission, normal to mild range pressures since arrival.  Augustin Coupe, MD/MPH OB Fellow  02/24/2019, 10:36 AM

## 2019-02-25 ENCOUNTER — Inpatient Hospital Stay (HOSPITAL_COMMUNITY): Payer: Managed Care, Other (non HMO) | Admitting: Anesthesiology

## 2019-02-25 ENCOUNTER — Encounter (HOSPITAL_COMMUNITY)
Admission: RE | Disposition: A | Discharge status: Home / Self Care | Payer: Self-pay | Source: Home / Self Care | Attending: Obstetrics and Gynecology

## 2019-02-25 DIAGNOSIS — O134 Gestational [pregnancy-induced] hypertension without significant proteinuria, complicating childbirth: Secondary | ICD-10-CM

## 2019-02-25 DIAGNOSIS — Z3A39 39 weeks gestation of pregnancy: Secondary | ICD-10-CM

## 2019-02-25 DIAGNOSIS — O411290 Chorioamnionitis, unspecified trimester, not applicable or unspecified: Secondary | ICD-10-CM

## 2019-02-25 DIAGNOSIS — O411230 Chorioamnionitis, third trimester, not applicable or unspecified: Secondary | ICD-10-CM

## 2019-02-25 LAB — CBC
HCT: 34 % — ABNORMAL LOW (ref 36.0–46.0)
HCT: 32.9 % — ABNORMAL LOW (ref 36.0–46.0)
Hemoglobin: 11.8 g/dL — ABNORMAL LOW (ref 12.0–15.0)
Hemoglobin: 11.3 g/dL — ABNORMAL LOW (ref 12.0–15.0)
MCH: 31.3 pg (ref 26.0–34.0)
MCH: 31.7 pg (ref 26.0–34.0)
MCHC: 34.7 g/dL (ref 30.0–36.0)
MCHC: 34.3 g/dL (ref 30.0–36.0)
MCV: 90.2 fL (ref 80.0–100.0)
MCV: 92.2 fL (ref 80.0–100.0)
Platelets: 132 10*3/uL — ABNORMAL LOW (ref 150–400)
Platelets: 146 10*3/uL — ABNORMAL LOW (ref 150–400)
RBC: 3.77 MIL/uL — ABNORMAL LOW (ref 3.87–5.11)
RBC: 3.57 MIL/uL — ABNORMAL LOW (ref 3.87–5.11)
RDW: 12.6 % (ref 11.5–15.5)
RDW: 12.5 % (ref 11.5–15.5)
WBC: 15.3 10*3/uL — ABNORMAL HIGH (ref 4.0–10.5)
WBC: 26.5 10*3/uL — ABNORMAL HIGH (ref 4.0–10.5)
nRBC: 0 % (ref 0.0–0.2)
nRBC: 0 % (ref 0.0–0.2)

## 2019-02-25 SURGERY — Surgical Case
Anesthesia: Epidural

## 2019-02-25 MED ORDER — OXYCODONE HCL 5 MG PO TABS
5.0000 mg | ORAL_TABLET | ORAL | Status: DC | PRN
  Administered 2019-02-27: 5 mg via ORAL
  Filled 2019-02-25: qty 1

## 2019-02-25 MED ORDER — SIMETHICONE 80 MG PO CHEW
80.0000 mg | CHEWABLE_TABLET | ORAL | Status: DC | PRN
  Filled 2019-02-25: qty 1

## 2019-02-25 MED ORDER — ONDANSETRON HCL 4 MG/2ML IJ SOLN
INTRAMUSCULAR | Status: AC
  Filled 2019-02-25: qty 2

## 2019-02-25 MED ORDER — ACETAMINOPHEN 500 MG PO TABS
ORAL_TABLET | ORAL | Status: AC
  Filled 2019-02-25: qty 2

## 2019-02-25 MED ORDER — OXYTOCIN 10 UNIT/ML IJ SOLN
INTRAMUSCULAR | Status: DC | PRN
  Administered 2019-02-25: 40 [IU] via INTRAMUSCULAR

## 2019-02-25 MED ORDER — MORPHINE SULFATE (PF) 0.5 MG/ML IJ SOLN
INTRAMUSCULAR | Status: AC
  Filled 2019-02-25: qty 10

## 2019-02-25 MED ORDER — LIDOCAINE HCL (PF) 1 % IJ SOLN
INTRAMUSCULAR | Status: DC | PRN
  Administered 2019-02-25: 5 mL via EPIDURAL

## 2019-02-25 MED ORDER — EPHEDRINE 5 MG/ML INJ: 10.0000 mg | INTRAVENOUS | Status: DC | PRN

## 2019-02-25 MED ORDER — COCONUT OIL OIL
1.0000 "application " | TOPICAL_OIL | Status: DC | PRN
  Administered 2019-02-27: 1 via TOPICAL

## 2019-02-25 MED ORDER — SIMETHICONE 80 MG PO CHEW
80.0000 mg | CHEWABLE_TABLET | ORAL | Status: DC
  Administered 2019-02-26 – 2019-02-27 (×3): 80 mg via ORAL
  Filled 2019-02-25 (×3): qty 1

## 2019-02-25 MED ORDER — ONDANSETRON HCL 4 MG/2ML IJ SOLN: 4.0000 mg | Freq: Three times a day (TID) | INTRAMUSCULAR | Status: DC | PRN

## 2019-02-25 MED ORDER — NALOXONE HCL 4 MG/10ML IJ SOLN
1.0000 ug/kg/h | INTRAVENOUS | Status: DC | PRN
  Filled 2019-02-25: qty 5

## 2019-02-25 MED ORDER — DIPHENHYDRAMINE HCL 50 MG/ML IJ SOLN: 12.5000 mg | INTRAMUSCULAR | Status: DC | PRN

## 2019-02-25 MED ORDER — LACTATED RINGERS IV SOLN
INTRAVENOUS | Status: DC
Start: 2019-02-25 — End: 2019-02-28

## 2019-02-25 MED ORDER — NALOXONE HCL 0.4 MG/ML IJ SOLN: 0.4000 mg | INTRAMUSCULAR | Status: DC | PRN

## 2019-02-25 MED ORDER — SODIUM CHLORIDE 0.9 % IV SOLN
INTRAVENOUS | Status: DC | PRN
  Administered 2019-02-25: 20:00:00 via INTRAVENOUS

## 2019-02-25 MED ORDER — SENNOSIDES-DOCUSATE SODIUM 8.6-50 MG PO TABS
2.0000 | ORAL_TABLET | ORAL | Status: DC
  Administered 2019-02-26 – 2019-02-27 (×3): 2 via ORAL
  Filled 2019-02-25 (×3): qty 2

## 2019-02-25 MED ORDER — IBUPROFEN 600 MG PO TABS
600.0000 mg | ORAL_TABLET | Freq: Three times a day (TID) | ORAL | Status: DC | PRN
  Administered 2019-02-26 – 2019-02-28 (×4): 600 mg via ORAL
  Filled 2019-02-25 (×5): qty 1

## 2019-02-25 MED ORDER — ZOLPIDEM TARTRATE 5 MG PO TABS: 5.0000 mg | ORAL_TABLET | Freq: Every evening | ORAL | Status: DC | PRN

## 2019-02-25 MED ORDER — SCOPOLAMINE 1 MG/3DAYS TD PT72
1.0000 | MEDICATED_PATCH | Freq: Once | TRANSDERMAL | Status: DC
  Administered 2019-02-25: 1.5 mg via TRANSDERMAL

## 2019-02-25 MED ORDER — TETANUS-DIPHTH-ACELL PERTUSSIS 5-2.5-18.5 LF-MCG/0.5 IM SUSP: 0.5000 mL | Freq: Once | INTRAMUSCULAR | Status: DC

## 2019-02-25 MED ORDER — ACETAMINOPHEN 500 MG PO TABS
1000.0000 mg | ORAL_TABLET | Freq: Four times a day (QID) | ORAL | Status: AC
  Administered 2019-02-25 – 2019-02-26 (×4): 1000 mg via ORAL
  Filled 2019-02-25 (×3): qty 2

## 2019-02-25 MED ORDER — DIPHENHYDRAMINE HCL 25 MG PO CAPS: 25.0000 mg | ORAL_CAPSULE | Freq: Four times a day (QID) | ORAL | Status: DC | PRN

## 2019-02-25 MED ORDER — LACTATED RINGERS IV SOLN: INTRAVENOUS | Status: DC

## 2019-02-25 MED ORDER — ACETAMINOPHEN 325 MG PO TABS
650.0000 mg | ORAL_TABLET | Freq: Four times a day (QID) | ORAL | Status: DC | PRN
  Administered 2019-02-27 – 2019-02-28 (×2): 650 mg via ORAL
  Filled 2019-02-25 (×2): qty 2

## 2019-02-25 MED ORDER — SODIUM CHLORIDE 0.9 % IR SOLN
Status: DC | PRN
  Administered 2019-02-25: 1

## 2019-02-25 MED ORDER — PRENATAL MULTIVITAMIN CH
1.0000 | ORAL_TABLET | Freq: Every day | ORAL | Status: DC
  Administered 2019-02-26 – 2019-02-27 (×2): 1 via ORAL
  Filled 2019-02-25 (×2): qty 1

## 2019-02-25 MED ORDER — KETOROLAC TROMETHAMINE 30 MG/ML IJ SOLN: 30.0000 mg | Freq: Four times a day (QID) | INTRAMUSCULAR | Status: AC | PRN

## 2019-02-25 MED ORDER — OXYTOCIN 40 UNITS IN NORMAL SALINE INFUSION - SIMPLE MED: 2.5000 [IU]/h | INTRAVENOUS | Status: AC

## 2019-02-25 MED ORDER — WITCH HAZEL-GLYCERIN EX PADS: 1.0000 "application " | MEDICATED_PAD | CUTANEOUS | Status: DC | PRN

## 2019-02-25 MED ORDER — FENTANYL-BUPIVACAINE-NACL 0.5-0.125-0.9 MG/250ML-% EP SOLN
12.0000 mL/h | EPIDURAL | Status: DC | PRN
  Filled 2019-02-25: qty 250

## 2019-02-25 MED ORDER — SIMETHICONE 80 MG PO CHEW
80.0000 mg | CHEWABLE_TABLET | Freq: Three times a day (TID) | ORAL | Status: DC
  Administered 2019-02-26 – 2019-02-28 (×5): 80 mg via ORAL
  Filled 2019-02-25 (×6): qty 1

## 2019-02-25 MED ORDER — PHENYLEPHRINE 40 MCG/ML (10ML) SYRINGE FOR IV PUSH (FOR BLOOD PRESSURE SUPPORT): 80.0000 ug | PREFILLED_SYRINGE | INTRAVENOUS | Status: DC | PRN

## 2019-02-25 MED ORDER — CEFAZOLIN SODIUM-DEXTROSE 2-4 GM/100ML-% IV SOLN: 2.0000 g | Freq: Once | INTRAVENOUS | Status: DC

## 2019-02-25 MED ORDER — FENTANYL CITRATE (PF) 100 MCG/2ML IJ SOLN
INTRAMUSCULAR | Status: AC
  Filled 2019-02-25: qty 2

## 2019-02-25 MED ORDER — MORPHINE SULFATE (PF) 0.5 MG/ML IJ SOLN
INTRAMUSCULAR | Status: DC | PRN
  Administered 2019-02-25: 3 mg via EPIDURAL

## 2019-02-25 MED ORDER — SODIUM CHLORIDE 0.9 % IV SOLN
2.0000 g | Freq: Four times a day (QID) | INTRAVENOUS | Status: DC
  Administered 2019-02-25 (×2): 2 g via INTRAVENOUS
  Filled 2019-02-25 (×5): qty 2000

## 2019-02-25 MED ORDER — OXYTOCIN 40 UNITS IN NORMAL SALINE INFUSION - SIMPLE MED
INTRAVENOUS | Status: AC
  Filled 2019-02-25: qty 1000

## 2019-02-25 MED ORDER — MENTHOL 3 MG MT LOZG: 1.0000 | LOZENGE | OROMUCOSAL | Status: DC | PRN

## 2019-02-25 MED ORDER — SODIUM CHLORIDE 0.9% FLUSH: 3.0000 mL | INTRAVENOUS | Status: DC | PRN

## 2019-02-25 MED ORDER — MEPERIDINE HCL 25 MG/ML IJ SOLN: 6.2500 mg | INTRAMUSCULAR | Status: DC | PRN

## 2019-02-25 MED ORDER — LIDOCAINE-EPINEPHRINE (PF) 2 %-1:200000 IJ SOLN
INTRAMUSCULAR | Status: AC
  Filled 2019-02-25: qty 10

## 2019-02-25 MED ORDER — FENTANYL CITRATE (PF) 100 MCG/2ML IJ SOLN
25.0000 ug | INTRAMUSCULAR | Status: DC | PRN
  Administered 2019-02-25 (×2): 50 ug via INTRAVENOUS

## 2019-02-25 MED ORDER — DIPHENHYDRAMINE HCL 25 MG PO CAPS: 25.0000 mg | ORAL_CAPSULE | ORAL | Status: DC | PRN

## 2019-02-25 MED ORDER — LIDOCAINE-EPINEPHRINE 2 %-1:100000 IJ SOLN
INTRAMUSCULAR | Status: DC | PRN
  Administered 2019-02-25: 10 mL via INTRADERMAL
  Administered 2019-02-25: 5 mL via INTRADERMAL

## 2019-02-25 MED ORDER — BUPIVACAINE HCL (PF) 0.5 % IJ SOLN
INTRAMUSCULAR | Status: AC
  Filled 2019-02-25: qty 30

## 2019-02-25 MED ORDER — ENOXAPARIN SODIUM 40 MG/0.4ML ~~LOC~~ SOLN
40.0000 mg | SUBCUTANEOUS | Status: DC
  Administered 2019-02-26 – 2019-02-28 (×3): 40 mg via SUBCUTANEOUS
  Filled 2019-02-25 (×3): qty 0.4

## 2019-02-25 MED ORDER — BUPIVACAINE HCL (PF) 0.5 % IJ SOLN
INTRAMUSCULAR | Status: DC | PRN
  Administered 2019-02-25: 30 mL

## 2019-02-25 MED ORDER — SCOPOLAMINE 1 MG/3DAYS TD PT72
MEDICATED_PATCH | TRANSDERMAL | Status: AC
  Filled 2019-02-25: qty 1

## 2019-02-25 MED ORDER — GENTAMICIN SULFATE 40 MG/ML IJ SOLN
5.0000 mg/kg | INTRAVENOUS | Status: DC
  Administered 2019-02-25: 300 mg via INTRAVENOUS
  Filled 2019-02-25 (×2): qty 7.5

## 2019-02-25 MED ORDER — METOCLOPRAMIDE HCL 5 MG/ML IJ SOLN: 10.0000 mg | Freq: Once | INTRAMUSCULAR | Status: DC | PRN

## 2019-02-25 MED ORDER — SODIUM CHLORIDE 0.9 % IV SOLN: 500.0000 mg | Freq: Once | INTRAVENOUS | Status: DC

## 2019-02-25 MED ORDER — NALOXONE HCL 4 MG/10ML IJ SOLN: 1.0000 ug/kg/h | INTRAVENOUS | Status: DC

## 2019-02-25 MED ORDER — LACTATED RINGERS IV SOLN
500.0000 mL | Freq: Once | INTRAVENOUS | Status: AC
  Administered 2019-02-25: 500 mL via INTRAVENOUS

## 2019-02-25 MED ORDER — SODIUM CHLORIDE (PF) 0.9 % IJ SOLN
INTRAMUSCULAR | Status: DC | PRN
  Administered 2019-02-25: 12 mL/h via EPIDURAL

## 2019-02-25 MED ORDER — ONDANSETRON HCL 4 MG/2ML IJ SOLN
INTRAMUSCULAR | Status: DC | PRN
  Administered 2019-02-25: 4 mg via INTRAVENOUS

## 2019-02-25 MED ORDER — DIBUCAINE (PERIANAL) 1 % EX OINT: 1.0000 "application " | TOPICAL_OINTMENT | CUTANEOUS | Status: DC | PRN

## 2019-02-25 SURGICAL SUPPLY — 37 items
BENZOIN TINCTURE PRP APPL 2/3 (GAUZE/BANDAGES/DRESSINGS) ×2 IMPLANT
CHLORAPREP W/TINT 26ML (MISCELLANEOUS) ×3 IMPLANT
CLAMP CORD UMBIL (MISCELLANEOUS) IMPLANT
CLOSURE WOUND 1/2 X4 (GAUZE/BANDAGES/DRESSINGS) ×1
CLOTH BEACON ORANGE TIMEOUT ST (SAFETY) ×3 IMPLANT
DRSG OPSITE POSTOP 4X10 (GAUZE/BANDAGES/DRESSINGS) ×3 IMPLANT
ELECT REM PT RETURN 9FT ADLT (ELECTROSURGICAL) ×3
ELECTRODE REM PT RTRN 9FT ADLT (ELECTROSURGICAL) ×1 IMPLANT
EXTRACTOR VACUUM BELL STYLE (SUCTIONS) IMPLANT
GAUZE SPONGE 4X4 12PLY STRL LF (GAUZE/BANDAGES/DRESSINGS) ×4 IMPLANT
GLOVE BIOGEL PI IND STRL 6.5 (GLOVE) ×1 IMPLANT
GLOVE BIOGEL PI IND STRL 7.0 (GLOVE) ×2 IMPLANT
GLOVE BIOGEL PI INDICATOR 6.5 (GLOVE) ×2
GLOVE BIOGEL PI INDICATOR 7.0 (GLOVE) ×4
GLOVE ORTHOPEDIC STR SZ6.5 (GLOVE) ×3 IMPLANT
GOWN STRL REUS W/TWL LRG LVL3 (GOWN DISPOSABLE) ×9 IMPLANT
HEMOSTAT SURGICEL 4X8 (HEMOSTASIS) ×2 IMPLANT
KIT ABG SYR 3ML LUER SLIP (SYRINGE) IMPLANT
NDL HYPO 25X1 1.5 SAFETY (NEEDLE) IMPLANT
NEEDLE HYPO 22GX1.5 SAFETY (NEEDLE) ×3 IMPLANT
NEEDLE HYPO 25X1 1.5 SAFETY (NEEDLE) IMPLANT
NS IRRIG 1000ML POUR BTL (IV SOLUTION) ×3 IMPLANT
PACK C SECTION WH (CUSTOM PROCEDURE TRAY) ×3 IMPLANT
PAD ABD 8X10 STRL (GAUZE/BANDAGES/DRESSINGS) ×2 IMPLANT
PAD OB MATERNITY 4.3X12.25 (PERSONAL CARE ITEMS) ×3 IMPLANT
PENCIL SMOKE EVAC W/HOLSTER (ELECTROSURGICAL) ×3 IMPLANT
STRIP CLOSURE SKIN 1/2X4 (GAUZE/BANDAGES/DRESSINGS) ×1 IMPLANT
SUT MON AB 4-0 PS1 27 (SUTURE) ×3 IMPLANT
SUT PLAIN 2 0 (SUTURE) ×2
SUT PLAIN ABS 2-0 CT1 27XMFL (SUTURE) ×1 IMPLANT
SUT VIC AB 0 CT1 36 (SUTURE) ×6 IMPLANT
SUT VIC AB 0 CTX 36 (SUTURE) ×2
SUT VIC AB 0 CTX36XBRD ANBCTRL (SUTURE) ×1 IMPLANT
SYR CONTROL 10ML LL (SYRINGE) ×3 IMPLANT
TOWEL OR 17X24 6PK STRL BLUE (TOWEL DISPOSABLE) ×3 IMPLANT
TRAY FOLEY W/BAG SLVR 14FR LF (SET/KITS/TRAYS/PACK) ×3 IMPLANT
WATER STERILE IRR 1000ML POUR (IV SOLUTION) ×3 IMPLANT

## 2019-02-25 NOTE — Progress Notes (Addendum)
Adriana Ross is a 28 y.o. G2P0010 at [redacted]w[redacted]d admitted for IOL for gHTN.   Subjective: Trying to rest. Feeling fairly comfortable with Epidural in place.   Objective: BP (!) 144/74   Pulse (!) 134   Temp 98.9 F (37.2 C) (Axillary)   Resp 18   Ht 5\' 2"  (1.575 m)   Wt 77.1 kg   LMP 05/23/2018   SpO2 100%   BMI 31.09 kg/m  I/O last 3 completed shifts: In: 1740 [I.V.:1740] Out: 650 [Urine:650] Total I/O In: -  Out: 550 [Urine:550]  FHT:  FHR: 155 bpm, variability: minimal,  accelerations:  Present. Intermittent subtle lates and variables.  UC:   regular, every 3-4 minutes Dilation: 4 Effacement %: 70 Station: ballotable Presentation: vertex  At last check 2 hours ago by Dr. Juleen China.  Labs: Lab Results  Component Value Date   WBC 15.3 (H) 02/25/2019   HGB 11.8 (L) 02/25/2019   HCT 34.0 (L) 02/25/2019   MCV 90.2 02/25/2019   PLT 132 (L) 02/25/2019    Assessment / Plan: Adriana Ross is a 28 y.o. G2P0010 at [redacted]w[redacted]d  admitted for IOL for gHTN.   Labor: IUPC replaced and terb given this morning. Pit now restarted. Cat II FHT with maternal temp not febrile but rising as well as FHR baseline. Amp and Gent ordered for suspected Chorio.  Preeclampsia:  no signs or symptoms of toxicity Fetal Wellbeing:  Category II  Pain Control:  Epidural  I/D:  gbs negative  Anticipated MOD: anticipate NSVD  Barrington Ellison, MD OB Family Medicine Fellow, [facility] for [facility], [place]

## 2019-02-25 NOTE — Op Note (Addendum)
Adriana Ross Bruss PROCEDURE DATE: 02/25/2019  PREOPERATIVE DIAGNOSES: Intrauterine pregnancy at 6166w5d weeks gestation; chorioamnionitis and failed induction, gestational hypertension  POSTOPERATIVE DIAGNOSES: The same  PROCEDURE: Primary Low Transverse Cesarean Section  SURGEON:  Dr. Leroy LibmanKelly Davis  ASSISTANT:  Jerilynn Birkenheadhelsea Fair, MD & Mirian MoPeter Frank, MD  An experienced assistant was required given the standard of surgical care given the complexity of the case.  This assistant was needed for exposure, dissection, suctioning, retraction, instrument exchange, assisting with delivery with administration of fundal pressure, and for overall help during the procedure.   ANESTHESIOLOGY TEAM: Anesthesiologist: Phillips Groutarignan, Peter, MD; Trevor IhaHouser, Stephen A, MD; Elmer PickerWoodrum, Chelsey L, MD CRNA: Rudi RummageLowder, Hal J, CRNA  INDICATIONS: Adriana Ross Covel is a 28 y.o. G2P0010 at 466w5d here for cesarean section secondary to the indications listed under preoperative diagnoses; please see preoperative note for further details.  The risks of cesarean section were discussed with the patient including but were not limited to: bleeding which may require transfusion or reoperation; infection which may require antibiotics; injury to bowel, bladder, ureters or other surrounding organs; injury to the fetus; need for additional procedures including hysterectomy in the event of a life-threatening hemorrhage; placental abnormalities wth subsequent pregnancies, incisional problems, thromboembolic phenomenon and other postoperative/anesthesia complications.   The patient concurred with the proposed plan, giving informed written consent for the procedure.    FINDINGS:  Viable female infant in cephalic presentation. Meconium-stained amniotic fluid.  Intact placenta, three vessel cord.  Normal uterus, fallopian tubes and ovaries bilaterally. APGAR (1 MIN): 8   APGAR (5 MINS): 9   APGAR (10 MINS):    ANESTHESIA: Epidural  INTRAVENOUS FLUIDS: 1200 ml    ESTIMATED BLOOD LOSS: 490 ml URINE OUTPUT:  100 ml SPECIMENS: Placenta sent to pathology COMPLICATIONS: None immediate  PROCEDURE IN DETAIL:  The patient preoperatively received intravenous antibiotics and had sequential compression devices applied to her lower extremities.  She was then taken to the operating room where the epidural anesthesia was dosed up to surgical level and was found to be adequate. She was then placed in a dorsal supine position with a leftward tilt, and prepped and draped in a sterile manner.  A foley catheter was placed into her bladder and attached to constant gravity.  After an adequate timeout was performed, a Pfannenstiel skin incision was made with scalpel and carried through to the underlying layer of fascia. The fascia was incised in the midline, and this incision was extended bilaterally using the Mayo scissors.  Kocher clamps were applied to the inferior aspect of the fascial incision and the underlying rectus muscles were dissected off bluntly and sharply.  A similar process was carried out on the superior aspect of the fascial incision. The rectus muscles were separated in the midline and the peritoneum was entered bluntly. The Alexis self-retaining retractor was introduced into the abdominal cavity and care taken to ensure no bowel/omentum caught in it.  Attention was turned to the lower uterine segment where a low transverse hysterotomy was made with a scalpel and extended bilaterally bluntly.  The infant was successfully delivered from vertex position, ROP, the cord was clamped and cut after one minute, and the infant was handed over to the awaiting neonatology team. Uterine massage was then administered, and the placenta delivered intact with a three-vessel cord. The uterus was then cleared of clots and debris.  The hysterotomy was closed with 0 Vicryl in a running locked fashion, and an imbricating layer was also placed with 0 Vicryl.  Figure-of-eight  0 Vicryl serosal  stitches were placed to help with hemostasis.  The pelvis was cleared of all clot and debris. Hemostasis was confirmed on all surfaces. Surgicel applied to lower uterine segment. The retractor was removed.  The peritoneum was closed with a 2-0 Vicryl running stitch. The fascia was then closed using 0 Vicryl in a running fashion.  The subcutaneous layer was irrigated, reapproximated with 2-0 plain gut running stitches, and the skin was closed with a 4-0 Monocryl subcuticular stitch. 0.5% Marcaine injected around incision site. The patient tolerated the procedure well. Sponge, instrument and needle counts were correct x 3.  She was taken to the recovery room in stable condition.   Barrington Ellison, MD OB Family Medicine Fellow, [facility] for [employer], [place] of Attending Supervision of [place] Fellow: Evaluation, management, and procedures were performed by the [facility] Fellow under my supervision and collaboration. I was scrubbed and present for all portions of this procedure. I agree with the documentation and plan.  Feliz Beam, Ross.D. Attending Center for [employer] (Faculty Practice)  02/26/2019 1:43 AM

## 2019-02-25 NOTE — Progress Notes (Signed)
Adriana Ross is a 28 y.o. G2P0010 at [redacted]w[redacted]d  admitted for IOL for gHTN.   Subjective: Feeling mild contractions. Denies headache, visual disturbance or RUQ pain.  Objective: BP (!) 139/91   Pulse 98   Temp 98.3 F (36.8 C) (Oral)   Resp 17   Ht 5\' 2"  (1.575 m)   Wt 77.1 kg   LMP 05/23/2018   BMI 31.09 kg/m  No intake/output data recorded. No intake/output data recorded.  FHT:  FHR: 140 bpm, variability: moderate,  accelerations:  Present,  decelerations:  Absent UC:   regular, every 1-4 minutes Dilation: 4 Effacement %: 70 Station: ballotable Presentation: vertex   Labs: Lab Results  Component Value Date   WBC 11.5 (H) 02/23/2019   HGB 11.7 (L) 02/23/2019   HCT 33.5 (L) 02/23/2019   MCV 89.1 02/23/2019   PLT 146 (L) 02/23/2019    Assessment / Plan: Adriana Ross is a 28 y.o. G2P0010 at [redacted]w[redacted]d  admitted for IOL for gHTN.   Labor: Progressing normally, s/p cytotec x 4, FB fell out at [place] started at [facility] on 8/27. Pt has tachysystole, on 4mg  of Pitocin. Cont same dose of pitocin and monitoring contractions. Consider AROM as fetal head descends Preeclampsia:  no signs or symptoms of toxicity Fetal Wellbeing:  Category I fetal trace reassuring  Pain Control:  IV pain meds considering epidural in active labor  I/D:  gbs negative  Anticipated MOD: anticipate NSVD, c-section if necessary   [name] MD PGY-1, [facility]  02/25/2019, 2:53 AM

## 2019-02-25 NOTE — Anesthesia Preprocedure Evaluation (Signed)
Anesthesia Evaluation  Patient identified by MRN, date of birth, ID band  Reviewed: Allergy & Precautions, NPO status , Patient's Chart, lab work & pertinent test results  Airway Mallampati: II  TM Distance: >3 FB Neck ROM: Full    Dental no notable dental hx. (+) Teeth Intact   Pulmonary neg pulmonary ROS, former smoker,    Pulmonary exam normal breath sounds clear to auscultation       Cardiovascular hypertension, Normal cardiovascular exam Rhythm:Regular Rate:Normal  Gestational HTN   Neuro/Psych negative neurological ROS  negative psych ROS   GI/Hepatic Neg liver ROS,   Endo/Other  negative endocrine ROS  Renal/GU negative Renal ROS     Musculoskeletal negative musculoskeletal ROS (+)   Abdominal   Peds  Hematology   Anesthesia Other Findings   Reproductive/Obstetrics (+) Pregnancy                             Lab Results  Component Value Date   WBC 15.3 (H) 02/25/2019   HGB 11.8 (L) 02/25/2019   HCT 34.0 (L) 02/25/2019   MCV 90.2 02/25/2019   PLT 132 (L) 02/25/2019    Anesthesia Physical Anesthesia Plan  ASA: III  Anesthesia Plan: Epidural   Post-op Pain Management:    Induction:   PONV Risk Score and Plan:   Airway Management Planned:   Additional Equipment:   Intra-op Plan:   Post-operative Plan:   Informed Consent: I have reviewed the patients History and Physical, chart, labs and discussed the procedure including the risks, benefits and alternatives for the proposed anesthesia with the patient or authorized representative who has indicated his/her understanding and acceptance.       Plan Discussed with:   Anesthesia Plan Comments:         Anesthesia Quick Evaluation

## 2019-02-25 NOTE — Discharge Summary (Addendum)
  Postpartum Discharge Summary     Patient Name: Adriana Ross DOB: 05/03/1991 MRN: [id number]  Date of admission: 02/23/2019 Delivering Provider: DAVIS, KELLY M   Date of discharge: 02/27/2019  Admitting diagnosis: Induction Intrauterine pregnancy: [redacted]w[redacted]d     Secondary diagnosis:  Active Problems:   HSV infection   Gestational hypertension   Chorioamnionitis  Additional problems: None     Discharge diagnosis: Term Pregnancy Delivered, Gestational Hypertension and Chorioamnionitis                                                                                                 Post partum procedures:none  Augmentation: AROM, Pitocin, Cytotec and Foley Balloon  Complications: Intrauterine Inflammation or infection (Chorioamniotis)  Hospital course:  Induction of Labor With Cesarean Section  28 y.o. yo G2P0010 at [redacted]w[redacted]d was admitted to the hospital 02/23/2019 for induction of labor. Patient had a labor course significant for gHTN. The patient went for cesarean section due to Arrest of Dilation and chorioamnionitis, and delivered a Viable infant,02/25/2019  Membrane Rupture Time/Date: 5:49 AM ,02/25/2019   Details of operation can be found in separate operative Note.  Patient had an uncomplicated postpartum course. She is ambulating, tolerating a regular diet, passing flatus, and urinating well.  Patient is discharged home in stable condition on 02/27/19.                                   Delivery time: 8:21 PM    Magnesium Sulfate received: No BMZ received: No Rhophylac:No MMR:No Transfusion:No  Physical exam  Vitals:   02/26/19 1530 02/26/19 1928 02/26/19 2125 02/27/19 0605  BP: 117/73 132/90 137/87 130/84  Pulse: 92 99 (!) 103 93  Resp: 16 17 18 18  Temp: 98.2 F (36.8 C) 99.1 F (37.3 C) 98.7 F (37.1 C) 98.6 F (37 C)  TempSrc: Oral Oral Oral Oral  SpO2:    99%  Weight:      Height:       General: alert, cooperative and no distress Lochia: appropriate Uterine  Fundus: firm Incision: Healing well with no significant drainage, No significant erythema, Dressing is clean, dry, and intact DVT Evaluation: No evidence of DVT seen on physical exam. Labs: Lab Results  Component Value Date   WBC 23.0 (H) 02/26/2019   HGB 10.0 (L) 02/26/2019   HCT 28.4 (L) 02/26/2019   MCV 90.7 02/26/2019   PLT 127 (L) 02/26/2019   CMP Latest Ref Rng & Units 02/23/2019  Glucose 70 - 99 mg/dL 97  BUN 6 - 20 mg/dL 6  Creatinine 0.44 - 1.00 mg/dL 0.60  Sodium 135 - 145 mmol/L 139  Potassium 3.5 - 5.1 mmol/L 3.5  Chloride 98 - 111 mmol/L 108  CO2 22 - 32 mmol/L 17(L)  Calcium 8.9 - 10.3 mg/dL 9.2  Total Protein 6.5 - 8.1 g/dL 6.7  Total Bilirubin 0.3 - 1.2 mg/dL 0.4  Alkaline Phos 38 - 126 U/L 157(H)  AST 15 - 41 U/L 23  ALT 0 - 44 U/L 16      Discharge instruction: per After Visit Summary and "Baby and Me Booklet".  After visit meds:  Allergies as of 02/27/2019   No Known Allergies     Medication List    TAKE these medications   acetaminophen 325 MG tablet Commonly known as: TYLENOL Take 2 tablets (650 mg total) by mouth every 6 (six) hours as needed for mild pain (temperature > 101.5.).   ferrous sulfate 325 (65 FE) MG tablet Take 325 mg by mouth daily with breakfast.   ONE-A-DAY WOMENS PRENATAL 1 PO Take by mouth.   oxyCODONE 5 MG immediate release tablet Commonly known as: Oxy IR/ROXICODONE Take 1-2 tablets (5-10 mg total) by mouth every 4 (four) hours as needed for moderate pain.   Prenate Mini 29-0.6-0.4-350 MG Caps Take 1 capsule by mouth daily before breakfast.   terconazole 0.4 % vaginal cream Commonly known as: Terazol 7 Place 1 applicator vaginally at bedtime.   valACYclovir 1000 MG tablet Commonly known as: VALTREX Take 1 tablet (1,000 mg total) by mouth daily.       Diet: routine diet  Activity: Advance as tolerated. Pelvic rest for 6 weeks.   Outpatient follow up:4 weeks Follow up Appt:No future appointments. Follow up  Visit:   Please schedule this patient for Postpartum visit in: 4 weeks with the following provider: Any provider For C/S patients schedule nurse incision check in weeks 2 weeks: yes Low risk pregnancy complicated by: HTN Delivery mode:  CS Anticipated Birth Control:  Condoms PP Procedures needed: BP and incision check in 1-2 weeks  Schedule Integrated BH visit: no   Newborn Data: Live born female 7lb 10.6oz Birth Weight:   APGAR: 69, 9  Newborn Delivery   Birth date/time: 02/25/2019 20:21:00 Delivery type: C-Section, Low Transverse Trial of labor: Yes C-section categorization: Primary      Baby Feeding: Breast Disposition:home with mother   02/27/2019 Matilde Haymaker, MD  I confirm that I have verified the information documented in the resident student's note and that I have also personally reperformed the history, physical exam and all medical decision making activities of this service and have verified that all service and findings are accurately documented in this student's note.   Wende Mott, [place] 02/27/2019

## 2019-02-25 NOTE — Progress Notes (Signed)
Pharmacy Antibiotic Note  Adriana Ross is a 28 y.o. female admitted on 02/23/2019 with chorioamnionitis.  Pharmacy has been consulted for gentamicin dosing.  Plan: Start gentamicin 300mg  (5 mg/kg) Q24H using adjusted body weight.   Height: 5\' 2"  (157.5 cm) Weight: 170 lb (77.1 kg) IBW/kg (Calculated) : 50.1  Temp (24hrs), Avg:98.8 F (37.1 C), Min:98.3 F (36.8 C), Max:99.4 F (37.4 C)  Recent Labs  Lab 02/23/19 1202 02/25/19 0310  WBC 11.5* 15.3*  CREATININE 0.60  --     Estimated Creatinine Clearance: 103.4 mL/min (by C-G formula based on SCr of 0.6 mg/dL).    No Known Allergies  Antimicrobials this admission: Ampicillin 2g Q6H 8/28 >>  Gentamicin 300mg  Q24H 8/28 >>   Dose adjustments this admission: None  Microbiology results: None  Thank you for allowing pharmacy to be a part of this patient's care.  Stefanie Libel 02/25/2019 12:41 PM

## 2019-02-25 NOTE — Anesthesia Procedure Notes (Signed)

## 2019-02-25 NOTE — Progress Notes (Addendum)
Labor Progress Note Adriana Ross is a 28 y.o. G2P0010 at [redacted]w[redacted]d presented for IOL secondary to gHTN. S: Patient comfortable. Denies feeling contractions.   O:  BP (!) 157/86   Pulse (!) 110   Temp 99.2 F (37.3 C) (Oral)   Resp 18   Ht 5\' 2"  (1.575 m)   Wt 77.1 kg   LMP 05/23/2018   SpO2 100%   BMI 31.09 kg/m  EFM: 170, minimal to moderate variability, accels present without decels currently; Cat I/Cat II  CVE: Dilation: 3 Effacement (%): 60 Cervical Position: Middle Station: -2 Presentation: Vertex Exam by:: Dr. Rosana Hoes   A&P27 y.o. [id number] [redacted]w[redacted]d here for IOL for gHTN. Dr. Rosana Hoes and myself discussed options with patients due to lack of dilation since AROM despite IUPC and Pitocin. Discussed option to continue Pitocin break and restart Pit and replace IUPC in another few hours. Also discussed option of patient opting for Cesarean now. Discussed risks and benefits of both. Patient opted to proceed with Cesarean now.  The risks of cesarean section were discussed with the patient including but were not limited to: bleeding which may require transfusion or reoperation; infection which may require antibiotics; injury to bowel, bladder, ureters or other surrounding organs; injury to the fetus; need for additional procedures including hysterectomy in the event of a life-threatening hemorrhage; placental abnormalities wth subsequent pregnancies, incisional problems, thromboembolic phenomenon and other postoperative/anesthesia complications. The patient concurred with the proposed plan, giving informed written consent for the procedures.  Patient has been NPO since 8/27 2040, she will remain NPO for procedure. Anesthesia and OR aware.  Preoperative prophylactic antibiotics and SCDs ordered on call to the OR.  To OR when ready.  Chauncey Mann, MD 7:01 PM    In to meet patient, she is resting comfortably with her significant other. Reviewed course, lack of progress x 24 hours of pitocin.  Patient now with chorio, 3/60/-2 and some caput. Reviewed concerns for no progress despite pitocin x 24 hrs however as she is only 3 cm, cannot call her arrest of dilation. Reviewed options for pitocin break and continuing induction, however setting a stop point at which I would recommend c-section if no change, versus opting for c-section now. Answered all questions. After discussion, patient would like to proceed with c-section.   The risks of cesarean section were discussed with the patient; including but not limited to: infection which may require antibiotics; bleeding which may require transfusion or re-operation; injury to bowel, bladder, ureters or other surrounding organs; need for additional procedures; placental abnormalities wth subsequent pregnancies, risk of needing c-sections in future pregnancies, incisional problems, thromboembolic phenomenon and other postoperative/anesthesia complications. Answered all questions. The patient verbalized understanding of the plan, giving informed consent for the procedure. She is agreeable to blood transfusion in the event of emergency.  NPO Anesthesia and OR aware Preoperative prophylactic antibiotics and SCDs ordered on call to the OR  To OR when ready  K. Arvilla Meres, M.D. Attending Center for [employer] farm manager)

## 2019-02-25 NOTE — Transfer of Care (Signed)
Immediate Anesthesia Transfer of Care Note  Patient: Adriana Ross  Procedure(s) Performed: CESAREAN SECTION (N/A )  Patient Location: PACU  Anesthesia Type:Epidural  Level of Consciousness: awake  Airway & Oxygen Therapy: Patient Spontanous Breathing  Post-op Assessment: Report given to RN and Post -op Vital signs reviewed and stable  Post vital signs: Reviewed and stable  Last Vitals:  Vitals Value Taken Time  BP 135/78 02/25/19 2126  Temp    Pulse 126 02/25/19 2130  Resp 26 02/25/19 2130  SpO2 99 % 02/25/19 2130  Vitals shown include unvalidated device data.  Last Pain:  Vitals:   02/25/19 1900  TempSrc: Oral  PainSc: 0-No pain      Patients Stated Pain Goal: 5 (26/20/35 5974)  Complications: No apparent anesthesia complications

## 2019-02-25 NOTE — Progress Notes (Signed)
Patient ID: Adriana Ross, female   DOB: 11-Oct-1990, 28 y.o.   MRN: [id number] Comfortable with epidural  Vitals:   02/25/19 0455 02/25/19 0500 02/25/19 0505 02/25/19 0530  BP: (!) 143/86 140/85 (!) 151/78 (!) 143/69  Pulse: 100 100 97 (!) 109  Resp: 17 17 18 17   Temp:      TempSrc:      SpO2: 98% 96% 97%   Weight:      Height:       FHR reassuring UCs irregular  Dilation: 4.5 Effacement (%): 70 Cervical Position: Middle Station: -3 Presentation: Vertex Exam by:: Dr. Posey Pronto  AROM mod MSAF  Will anticipate increased labor

## 2019-02-26 ENCOUNTER — Encounter (HOSPITAL_COMMUNITY): Payer: Self-pay | Admitting: Obstetrics and Gynecology

## 2019-02-26 LAB — CBC
HCT: 28.4 % — ABNORMAL LOW (ref 36.0–46.0)
Hemoglobin: 10 g/dL — ABNORMAL LOW (ref 12.0–15.0)
MCH: 31.9 pg (ref 26.0–34.0)
MCHC: 35.2 g/dL (ref 30.0–36.0)
MCV: 90.7 fL (ref 80.0–100.0)
Platelets: 127 10*3/uL — ABNORMAL LOW (ref 150–400)
RBC: 3.13 MIL/uL — ABNORMAL LOW (ref 3.87–5.11)
RDW: 12.8 % (ref 11.5–15.5)
WBC: 23 10*3/uL — ABNORMAL HIGH (ref 4.0–10.5)
nRBC: 0 % (ref 0.0–0.2)

## 2019-02-26 MED ORDER — LORATADINE 10 MG PO TABS
10.0000 mg | ORAL_TABLET | Freq: Every day | ORAL | Status: DC | PRN
  Filled 2019-02-26: qty 1

## 2019-02-26 MED ORDER — DIPHENHYDRAMINE HCL 25 MG PO CAPS
25.0000 mg | ORAL_CAPSULE | ORAL | Status: DC | PRN
  Administered 2019-02-26: 25 mg via ORAL
  Filled 2019-02-26: qty 1

## 2019-02-26 NOTE — Progress Notes (Signed)
Subjective: Postpartum Day #1: Cesarean Delivery Patient reports tolerating PO; foley cath still in place; has stood without dizziness; breastfeeding going well; considering POPs  Objective: Vital signs in last 24 hours: Temp:  [98.7 F (37.1 C)-100.9 F (38.3 C)] 99 F (37.2 C) (08/29 0552) Pulse Rate:  [104-140] 112 (08/29 0100) Resp:  [18-24] 18 (08/29 0552) BP: (122-157)/(59-108) 149/90 (08/29 0100) SpO2:  [98 %-100 %] 100 % (08/29 0552)  Physical Exam:  General: alert, cooperative and no distress Lochia: appropriate Uterine Fundus: firm Incision: pressure dsg intact and dry DVT Evaluation: No evidence of DVT seen on physical exam.  Recent Labs    02/25/19 2151 02/26/19 0531  HGB 11.3* 10.0*  HCT 32.9* 28.4*    Assessment/Plan: Status post Cesarean section. Doing well postoperatively.  Continue current care. Anticipate d/c 02/27/19.  Myrtis Ser CNM 02/26/2019, 9:21 AM

## 2019-02-26 NOTE — Progress Notes (Signed)
Post Partum Day 1/POD 1 Subjective: no complaints, up ad lib, voiding, tolerating PO and + flatus  Objective: Blood pressure 127/85, pulse (!) 101, temperature 98.7 F (37.1 C), temperature source Oral, resp. rate 18, height 5\' 2"  (1.575 m), weight 77.1 kg, last menstrual period 05/23/2018, SpO2 100 %.  Physical Exam:  General: alert, cooperative, appears stated age and no distress Lochia: appropriate Uterine Fundus: firm Incision: Dressing clean, dry and intact DVT Evaluation: No evidence of DVT seen on physical exam.  Recent Labs    02/25/19 2151 02/26/19 0531  HGB 11.3* 10.0*  HCT 32.9* 28.4*    Assessment/Plan: Plan for discharge tomorrow   Darlina Rumpf, CNM 02/26/2019, 12:41 PM

## 2019-02-26 NOTE — Progress Notes (Signed)
Patient declined Tdap vaccine.

## 2019-02-26 NOTE — Anesthesia Postprocedure Evaluation (Signed)
Anesthesia Post Note  Patient: Adriana Ross  Procedure(s) Performed: CESAREAN SECTION (N/A )     Patient location during evaluation: PACU Anesthesia Type: Epidural Level of consciousness: awake and alert Pain management: pain level controlled Vital Signs Assessment: post-procedure vital signs reviewed and stable Respiratory status: spontaneous breathing and respiratory function stable Cardiovascular status: blood pressure returned to baseline and stable Postop Assessment: no headache, no backache, no apparent nausea or vomiting and epidural receding Anesthetic complications: no    Last Vitals:  Vitals:   02/25/19 2215 02/25/19 2255  BP: (!) 149/90 139/87  Pulse: (!) 116 (!) 115  Resp: (!) 21 19  Temp:  37.4 C  SpO2: 98%     Last Pain:  Vitals:   02/25/19 2300  TempSrc:   PainSc: 0-No pain   Pain Goal: Patients Stated Pain Goal: 1 (02/25/19 2145)                 Montez Hageman

## 2019-02-26 NOTE — Progress Notes (Signed)
RN spoke to midwife Derrill Memo about patient itching. Reorder benadryl per previous order.

## 2019-02-26 NOTE — Clinical Social Work Maternal (Signed)
CLINICAL SOCIAL WORK MATERNAL/CHILD NOTE  Patient Details  Name: Pryor MontesShaneka M Luddy MRN: [id number] Date of Birth: [birth year]  Date:  02/26/2019  Clinical Social Worker Initiating Note:  Hortencia PilarKIerra Kenedy Haisley, LCSW Date/Time: Initiated:  02/26/19/1240     Child's Name:  Daryll DrownKori Rollins   Biological Parents:  Mother, Father   Need for Interpreter:  None   Reason for Referral:  Current Substance Use/Substance Use During Pregnancy    Address:  [address] [place] [address]    Phone number:  [phone] (home)     Additional phone number: none   Household Members/Support Persons (HM/SP):   Household Member/Support Person 2   HM/SP Name Relationship DOB or Age  HM/SP -1   Shanna Cisconthony Rollins (FOB)  FOB   30  HM/SP -2 Mikka Zehner (MOB) MOB   27  HM/SP -3        HM/SP -4        HM/SP -5        HM/SP -6        HM/SP -7        HM/SP -8          Natural Supports (not living in the home):  Parent   Professional Supports: None   Employment: Full-time   Type of Work: Works at [employer]   Education:  Engineer, maintenance (IT)College graduate   Homebound arranged:  n/a  Surveyor, quantityinancial Resources:  [employer] EnergyMedicaid, Media plannerrivate Insurance   Other Resources:  AllstateWIC, Food Stamps    Cultural/Religious Considerations Which May Impact Care:  none reported.   Strengths:  Ability to meet basic needs , Compliance with medical plan , Home prepared for child    Psychotropic Medications:   none       [facility]ian[facility]     [facility]   Pediatrician List:   [facility]    [place]    [place]    [place]    [place]    [place]      Pediatrician Fax Number:    Risk Factors/Current Problems:  None   Cognitive State:  Alert , Able to Concentrate , Insightful    Mood/Affect:  Comfortable , Relaxed , Calm , Interested    CSW Assessment: CSW consulted as MOB used THC earlier in pregnancy. CSW spoke with MOB at bedside to address further needs.   Upon entering  the room CSW congratulated MOB and FOB on the birth of infant Eli Phillips([name]). CSW advised MOB of the reason for the visit. MOB reported that she did use THC earlier in her pregnancy. MOB reported that her last use was December 2019. CSW explained to MOB the hospital drug screen policy and advised MOB that there are two different forms of drug screens that are done on infants. MOB and FOB both expressed understanding of the policy with no further questions.   MOB reports that she has no mental health diagnosis. MOB denies feeling SI and HI at this time. MOB reports that she works for [employer] in which sh e is on leave from at this item. MOB reports that she gets Sales executiveood Stamps and [facility] as well. CSW made aware that MOB and FOB have all essential items to care fro infant. MOB expressed that [facility] once discharged for further care.   MOB was provided education on PPD and SIDS. MOB was also given PPD Checklist to keep track of feelings as they relate to PPD. MOB  reports that she has been feeling fine since giving birth with no needs.   CSW will continue to monitor infants UDS and CDS for needed report.   CSW Plan/Description:  No Further Intervention Required/No Barriers to Discharge, Sudden Infant Death Syndrome (SIDS) Education, Perinatal Mood and Anxiety Disorder (PMADs) Education, CSW Will Continue to Monitor Umbilical Cord Tissue Drug Screen Results and Make Report if Waldon Reining 02/26/2019, 12:57 PM

## 2019-02-27 MED ORDER — OXYCODONE HCL 5 MG PO TABS: 5.0000 mg | ORAL_TABLET | ORAL | 0 refills | Status: AC | PRN

## 2019-02-27 MED ORDER — ACETAMINOPHEN 325 MG PO TABS: 650.0000 mg | ORAL_TABLET | Freq: Four times a day (QID) | ORAL | Status: AC | PRN

## 2019-02-27 NOTE — Progress Notes (Signed)
Discharge order discontinued due to patient preference to remain with baby through the night.

## 2019-02-27 NOTE — Discharge Instructions (Signed)

## 2019-02-27 NOTE — Lactation Note (Signed)
This note was copied from a baby's chart. Lactation Consultation Note  Patient Name: Adriana Ross [id number] Date: 02/27/2019 Reason for consult: Follow-up assessment;1st time breastfeeding;Term P1, 79 hour female, term infant. Per parents, infant had 2 voids and 3 stools yesterday. Infant is currently cluster feeding. Mom has been pre-pumping with hand pump prior to latching infant to breast  due having flat nipples. Mom not been wearing breast shells she had misplaced them but found them last night she  plans to start wearing them in the morning. Mom feels breastfeeding is going well, she has mainly latching infant on her  left breast, she   hasn't been trying to latch infant on her right breast because it is more flatter. LC assisted mom, mom latched infant on her right breast using the football hold, infant latched without difficulty and was still breastfeeding after 9 minutes when Orwin left room. Mom will continue to breastfeed according hunger cues, 8 to 12 times within 24 hours and on demand. Mom knows to call Nurse or Wing if she has any questions, concerns or need assistance with latching infant to breast.      Maternal Data    Feeding    LATCH Score Latch: Grasps breast easily, tongue down, lips flanged, rhythmical sucking.  Audible Swallowing: Spontaneous and intermittent  Type of Nipple: Flat  Comfort (Breast/Nipple): Soft / non-tender  Hold (Positioning): Assistance needed to correctly position infant at breast and maintain latch.  LATCH Score: 8  Interventions Interventions: Assisted with latch;Adjust position;Support pillows;Position options;Hand express  Lactation Tools Discussed/Used     Consult Status Consult Status: Follow-up Date: 02/27/19 Follow-up type: In-patient    Vicente Serene 02/27/2019, 2:15 AM

## 2019-02-27 NOTE — Lactation Note (Signed)
This note was copied from a baby's chart. Lactation Consultation Note  Patient Name: Adriana Ross MVEHM'C Date: 02/27/2019 Reason for consult: Follow-up assessment;Difficult latch;Primapara;1st time breastfeeding;Term  1546 - 1646 - I followed up with Ms. [name] to check on her progress with breast feeding. She began bottle feeding today due to breast pain.  Ms. [name] states that she understands the benefits of breast milk but finds it painful. She is interested in possibly pumping upon discharged and providing milk to her baby via bottle. Mom states that she had more sensitivity in her breasts during pregnancy. We discussed possible reasons for breast pain including latch and hormonal considerations.   Mom does not have a breast pump at home. I encouraged her to call [employer] to see if her plan provided one. Mom does have [place], and I offered to put in a referral.  I assisted [name] with latching baby "Nonnie Done" in cross cradle hold on her left breat. Mom's breasts flatten with compression (short/flat). Baby is eager to latch but occasionally smacks her way on the breast. I observed this behavior and suggested trying a nipple shield. We used a 24 NS, and baby took to it very well. Mom noted a small improvement in pain.  Baby fed several minutes with the shield and went to sleep. I removed the shield and set up a DEBP. I explained pumping frequency and settings to mom. Baby began to root again, and I assisted with latching without the use of the nipple shield. This time, Adriana Ross latched quickly with a wider mouth and deeper latch. Mom states that it was still painful. I showed her how to safely remove baby from breast. Mom's nipple was slightly compressed and slightly red, but in tact.  I showed dad how to pace bottle feed Adriana Ross while mom pumped. I encouraged mom to continue to practice latch. I praised her for her hard work, and also encouraged her to pump tonight. She is to breast feed as much as  possible and pump following or anytime she supplements. Dad was able to repeat back paced feeding. Baby took bottle well. I provided supplementation amounts for first 72 hours.  I reviewed plan/visit with RN. I offered to mom to put in a referral to [facility] regarding obtaining a pump. I did make mom aware that she needed to have high interest in pumping/providing breast milk to be eligible for the pump. She verbalized understanding.   Maternal Data Formula Feeding for Exclusion: No Does the patient have breastfeeding experience prior to this delivery?: No  Feeding Feeding Type: Breast Milk with Formula added Nipple Type: Slow - flow  LATCH Score Latch: Grasps breast easily, tongue down, lips flanged, rhythmical sucking.  Audible Swallowing: A few with stimulation  Type of Nipple: Flat  Comfort (Breast/Nipple): Filling, red/small blisters or bruises, mild/mod discomfort  Hold (Positioning): Assistance needed to correctly position infant at breast and maintain latch.  LATCH Score: 6  Interventions Interventions: Breast feeding basics reviewed;Assisted with latch;Skin to skin;Hand express;Pre-pump if needed;Breast compression;Adjust position;Support pillows;Position options;DEBP  Lactation Tools Discussed/Used Tools: Pump;Nipple [place] Fuel;Bottle;Feeding cup;Flanges Nipple shield size: 24 Flange Size: 27 Breast pump type: Double-Electric Breast Pump WIC Program: Yes Pump Review: Setup, frequency, and cleaning;Milk Storage Initiated by:: HL Date initiated:: 02/27/19   Consult Status Consult Status: Follow-up Date: 02/28/19 Follow-up type: In-patient    [name] 02/27/2019, 5:14 PM

## 2019-02-28 ENCOUNTER — Encounter (HOSPITAL_COMMUNITY): Payer: Self-pay | Admitting: Obstetrics and Gynecology

## 2019-02-28 NOTE — Discharge Summary (Signed)
Postpartum Discharge Summary     Patient Name: Adriana Ross DOB: March 11, 1991 MRN: [id number]  Date of admission: 02/23/2019 Delivering Provider: Sloan Leiter   Date of discharge: 02/28/2019  Admitting diagnosis: Induction Intrauterine pregnancy: [redacted]w[redacted]d    Secondary diagnosis:  Active Problems:   HSV infection   Gestational hypertension   Chorioamnionitis  Additional problems: None     Discharge diagnosis: Term Pregnancy Delivered, Gestational Hypertension and Chorioamnionitis                                                                                                 Post partum procedures:none  Augmentation: AROM, Pitocin, Cytotec and Foley Balloon  Complications: Intrauterine Inflammation or infection (Chorioamniotis)  Hospital course:  Induction of Labor With Cesarean Section  28y.o. yo G2P0010 at [id number] admitted to the hospital 02/23/2019 for induction of labor. Patient had a labor course significant for gHTN. The patient went for cesarean section due to Arrest of Dilation and chorioamnionitis, and delivered a Viable infant,02/25/2019  Membrane Rupture Time/Date: 5:49 AM ,02/25/2019   Details of operation can be found in separate operative Note.  Patient had an uncomplicated postpartum course. She is ambulating, tolerating a regular diet, passing flatus, and urinating well.  Patient owns BP cuff at home and given parameters for calling clinic if elevated. Appt requested for BP check. Pressures well-controlled post-partum. Patient is discharged home in stable condition on 02/28/19.                                   Delivery time: 8:21 PM   Magnesium Sulfate received: No BMZ received: No Rhophylac:No MMR:No Transfusion:No  Physical exam  Vitals:   02/26/19 1928 02/26/19 2125 02/27/19 0605 02/27/19 1504  BP: 132/90 137/87 130/84 121/77  Pulse: 99 (!) 103 93 89  Resp: _0 Temp: 99.1 F (37.3 C) 98.7 F (37.1 C) 98.6 F (37 C) 98.2 F (36.8 C)   TempSrc: Oral Oral Oral Oral  SpO2:   99% 99%  Weight:      Height:       General: alert, cooperative and no distress Lochia: appropriate Uterine Fundus: firm Incision: Healing well with no significant drainage, No significant erythema, Dressing is clean, dry, and intact DVT Evaluation: No evidence of DVT seen on physical exam. Labs: Lab Results  Component Value Date   WBC 23.0 (H) 02/26/2019   HGB 10.0 (L) 02/26/2019   HCT 28.4 (L) 02/26/2019   MCV 90.7 02/26/2019   PLT 127 (L) 02/26/2019   CMP Latest Ref Rng & Units 02/23/2019  Glucose 70 - 99 mg/dL 97  BUN 6 - 20 mg/dL 6  Creatinine 0.44 - 1.00 mg/dL 0.60  Sodium 135 - 145 mmol/L 139  Potassium 3.5 - 5.1 mmol/L 3.5  Chloride 98 - 111 mmol/L 108  CO2 22 - 32 mmol/L 17(L)  Calcium 8.9 - 10.3 mg/dL 9.2  Total Protein 6.5 - 8.1 g/dL 6.7  Total Bilirubin 0.3 - 1.2 mg/dL 0.4  Alkaline  Phos 38 - 126 U/L 157(H)  AST 15 - 41 U/L 23  ALT 0 - 44 U/L 16    Discharge instruction: per After Visit Summary and "Baby and Me Booklet".  After visit meds:  Allergies as of 02/28/2019   No Known Allergies     Medication List    TAKE these medications   acetaminophen 325 MG tablet Commonly known as: TYLENOL Take 2 tablets (650 mg total) by mouth every 6 (six) hours as needed for mild pain (temperature > 101.5.).   ferrous sulfate 325 (65 FE) MG tablet Take 325 mg by mouth daily with breakfast.   ONE-A-DAY WOMENS PRENATAL 1 PO Take by mouth.   oxyCODONE 5 MG immediate release tablet Commonly known as: Oxy IR/ROXICODONE Take 1-2 tablets (5-10 mg total) by mouth every 4 (four) hours as needed for moderate pain.   Prenate Mini 29-0.6-0.4-350 MG Caps Take 1 capsule by mouth daily before breakfast.   terconazole 0.4 % vaginal cream Commonly known as: Terazol 7 Place 1 applicator vaginally at bedtime.   valACYclovir 1000 MG tablet Commonly known as: VALTREX Take 1 tablet (1,000 mg total) by mouth daily.       Diet:  routine diet  Activity: Advance as tolerated. Pelvic rest for 6 weeks.   Outpatient follow up:4 weeks Follow up Appt:No future appointments. Follow up Visit:   Please schedule this patient for Postpartum visit in: 4 weeks with the following provider: Any provider For C/S patients schedule nurse incision check in weeks 2 weeks: yes Low risk pregnancy complicated by: HTN Delivery mode:  CS Anticipated Birth Control:  Condoms PP Procedures needed: BP and incision check in 1-2 weeks  Schedule Integrated BH visit: no   Newborn Data: Live born female 7lb 10.6oz Birth Weight:   APGAR: 36, 9  Newborn Delivery   Birth date/time: 02/25/2019 20:21:00 Delivery type: C-Section, Low Transverse Trial of labor: Yes C-section categorization: Primary      Baby Feeding: Breast Disposition:home with mother if bilirubin appropriate    02/28/2019 Chauncey Mann, MD

## 2019-02-28 NOTE — Addendum Note (Signed)
Addendum  created 02/28/19 1345 by Freddrick March, MD   Intraprocedure Event edited

## 2019-02-28 NOTE — Lactation Note (Signed)
This note was copied from a baby's chart. Lactation Consultation Note  Patient Name: Adriana Ross DTOIZ'T Date: 02/28/2019 Reason for consult: Follow-up assessment   Baby 22 hours old and mostly formula feeding. Mother states her nipples are sensitive so she plans to pump and bottle feed. No cracks or bleeding. Feed on demand approximately 8-12 times per day.   Reviewed engorgement care and monitoring voids/stools.    Maternal Data    Feeding Feeding Type: Bottle Fed - Formula  LATCH Score                   Interventions    Lactation Tools Discussed/Used     Consult Status Consult Status: Complete Date: 02/28/19    Adriana Ross [facility] 02/28/2019, 10:56 AM

## 2019-03-15 ENCOUNTER — Other Ambulatory Visit: Payer: Self-pay

## 2019-03-15 ENCOUNTER — Ambulatory Visit (INDEPENDENT_AMBULATORY_CARE_PROVIDER_SITE_OTHER): Payer: BC Managed Care – PPO | Admitting: Obstetrics and Gynecology

## 2019-03-15 ENCOUNTER — Encounter: Payer: Self-pay | Admitting: Obstetrics and Gynecology

## 2019-03-15 VITALS — BP 131/93 | HR 87 | Wt 145.3 lb

## 2019-03-15 DIAGNOSIS — Z98891 History of uterine scar from previous surgery: Secondary | ICD-10-CM

## 2019-03-15 DIAGNOSIS — O133 Gestational [pregnancy-induced] hypertension without significant proteinuria, third trimester: Secondary | ICD-10-CM

## 2019-03-15 MED ORDER — LABETALOL HCL 200 MG PO TABS: 200.0000 mg | ORAL_TABLET | Freq: Two times a day (BID) | ORAL | 3 refills | Status: AC

## 2019-03-15 NOTE — Progress Notes (Signed)
    Post Cesarean Section Incision Check  GERALD HONEA is a 28 y.o. African-American G2P0010 (Patient's last menstrual period was 05/23/2018.) who is s/p 1LTCS on 02/25/19 at 39 weeks for failed induction. She was discharged to home on POD#3. Pregnancy complicated by gHTN.  Complains of mild soreness. Denies other complaints, overall she is feeling well.   Physical Exam:  BP (!) 131/93   Pulse 87   Wt 145 lb 4.8 oz (65.9 kg)   LMP 05/23/2018   BMI 26.58 kg/m  Body mass index is 26.58 kg/m. General appearance: Well nourished, well developed female in no acute distress.  Abdomen: soft, appropriately tender, incision clean, dry, intact with no signs of active bleeding or infection   Assessment: Patient is a 28 y.o. G2P0010 who is 2 weeks post partum from a primary c-section. She is doing well. Incision appears to be healing appropriately. BP elevated today, she is asymptomatic, will start labetalol 200 mg BID  Plan:  Follow up 2 weeks for post partum visit.   Feliz Beam, M.D. Attending [facility] for [employer])

## 2019-03-15 NOTE — Progress Notes (Signed)
Pt is here for PP incision and BP check. C section 02/25/19.  Pt is currently breast feeding and bottle feeding Gerber formula. Pt would like to start OCP for contraception. Pt has not been SA since delivery. EPDS score: 2. Pt declines flu vaccine today.

## 2019-03-24 ENCOUNTER — Encounter: Payer: Self-pay | Admitting: Obstetrics and Gynecology

## 2019-03-24 ENCOUNTER — Other Ambulatory Visit: Payer: Self-pay

## 2019-03-24 ENCOUNTER — Ambulatory Visit (INDEPENDENT_AMBULATORY_CARE_PROVIDER_SITE_OTHER): Payer: BC Managed Care – PPO | Admitting: Obstetrics and Gynecology

## 2019-03-24 DIAGNOSIS — O139 Gestational [pregnancy-induced] hypertension without significant proteinuria, unspecified trimester: Secondary | ICD-10-CM

## 2019-03-24 DIAGNOSIS — Z3009 Encounter for other general counseling and advice on contraception: Secondary | ICD-10-CM

## 2019-03-24 DIAGNOSIS — Z98891 History of uterine scar from previous surgery: Secondary | ICD-10-CM

## 2019-03-24 MED ORDER — NORETHINDRONE 0.35 MG PO TABS: 1.0000 | ORAL_TABLET | Freq: Every day | ORAL | 11 refills | Status: AC

## 2019-03-24 NOTE — Progress Notes (Signed)
error 

## 2019-03-24 NOTE — Patient Instructions (Signed)
Levonorgestrel intrauterine device (IUD) What is this medicine? LEVONORGESTREL IUD (LEE voe nor jes trel) is a contraceptive (birth control) device. The device is placed inside the uterus by a healthcare professional. It is used to prevent pregnancy. This device can also be used to treat heavy bleeding that occurs during your period. This medicine may be used for other purposes; ask your health care provider or pharmacist if you have questions. COMMON BRAND NAME(S): Kyleena, LILETTA, Mirena, Skyla What should I tell my health care provider before I take this medicine? They need to know if you have any of these conditions:  abnormal Pap smear  cancer of the breast, uterus, or cervix  diabetes  endometritis  genital or pelvic infection now or in the past  have more than one sexual partner or your partner has more than one partner  heart disease  history of an ectopic or tubal pregnancy  immune system problems  IUD in place  liver disease or tumor  problems with blood clots or take blood-thinners  seizures  use intravenous drugs  uterus of unusual shape  vaginal bleeding that has not been explained  an unusual or allergic reaction to levonorgestrel, other hormones, silicone, or polyethylene, medicines, foods, dyes, or preservatives  pregnant or trying to get pregnant  breast-feeding How should I use this medicine? This device is placed inside the uterus by a health care professional. Talk to your pediatrician regarding the use of this medicine in children. Special care may be needed. Overdosage: If you think you have taken too much of this medicine contact a poison control center or emergency room at once. NOTE: This medicine is only for you. Do not share this medicine with others. What if I miss a dose? This does not apply. Depending on the brand of device you have inserted, the device will need to be replaced every 3 to 6 years if you wish to continue using this type  of birth control. What may interact with this medicine? Do not take this medicine with any of the following medications:  amprenavir  bosentan  fosamprenavir This medicine may also interact with the following medications:  aprepitant  armodafinil  barbiturate medicines for inducing sleep or treating seizures  bexarotene  boceprevir  griseofulvin  medicines to treat seizures like carbamazepine, ethotoin, felbamate, oxcarbazepine, phenytoin, topiramate  modafinil  pioglitazone  rifabutin  rifampin  rifapentine  some medicines to treat HIV infection like atazanavir, efavirenz, indinavir, lopinavir, nelfinavir, tipranavir, ritonavir  St. John's wort  warfarin This list may not describe all possible interactions. Give your health care provider a list of all the medicines, herbs, non-prescription drugs, or dietary supplements you use. Also tell them if you smoke, drink alcohol, or use illegal drugs. Some items may interact with your medicine. What should I watch for while using this medicine? Visit your doctor or health care professional for regular check ups. See your doctor if you or your partner has sexual contact with others, becomes HIV positive, or gets a sexual transmitted disease. This product does not protect you against HIV infection (AIDS) or other sexually transmitted diseases. You can check the placement of the IUD yourself by reaching up to the top of your vagina with clean fingers to feel the threads. Do not pull on the threads. It is a good habit to check placement after each menstrual period. Call your doctor right away if you feel more of the IUD than just the threads or if you cannot feel the threads at   all. The IUD may come out by itself. You may become pregnant if the device comes out. If you notice that the IUD has come out use a backup birth control method like condoms and call your health care provider. Using tampons will not change the position of  the IUD and are okay to use during your period. This IUD can be safely scanned with magnetic resonance imaging (MRI) only under specific conditions. Before you have an MRI, tell your healthcare provider that you have an IUD in place, and which type of IUD you have in place. What side effects may I notice from receiving this medicine? Side effects that you should report to your doctor or health care professional as soon as possible:  allergic reactions like skin rash, itching or hives, swelling of the face, lips, or tongue  fever, flu-like symptoms  genital sores  high blood pressure  no menstrual period for 6 weeks during use  pain, swelling, warmth in the leg  pelvic pain or tenderness  severe or sudden headache  signs of pregnancy  stomach cramping  sudden shortness of breath  trouble with balance, talking, or walking  unusual vaginal bleeding, discharge  yellowing of the eyes or skin Side effects that usually do not require medical attention (report to your doctor or health care professional if they continue or are bothersome):  acne  breast pain  change in sex drive or performance  changes in weight  cramping, dizziness, or faintness while the device is being inserted  headache  irregular menstrual bleeding within first 3 to 6 months of use  nausea This list may not describe all possible side effects. Call your doctor for medical advice about side effects. You may report side effects to FDA at 1-800-FDA-1088. Where should I keep my medicine? This does not apply. NOTE: This sheet is a summary. It may not cover all possible information. If you have questions about this medicine, talk to your doctor, pharmacist, or health care provider.  2020 Elsevier/Gold Standard (2018-04-27 13:22:01)  Etonogestrel implant What is this medicine? ETONOGESTREL (et oh noe JES trel) is a contraceptive (birth control) device. It is used to prevent pregnancy. It can be used for  up to 3 years. This medicine may be used for other purposes; ask your health care provider or pharmacist if you have questions. COMMON BRAND NAME(S): Implanon, Nexplanon What should I tell my health care provider before I take this medicine? They need to know if you have any of these conditions:  abnormal vaginal bleeding  blood vessel disease or blood clots  breast, cervical, endometrial, ovarian, liver, or uterine cancer  diabetes  gallbladder disease  heart disease or recent heart attack  high blood pressure  high cholesterol or triglycerides  kidney disease  liver disease  migraine headaches  seizures  stroke  tobacco smoker  an unusual or allergic reaction to etonogestrel, anesthetics or antiseptics, other medicines, foods, dyes, or preservatives  pregnant or trying to get pregnant  breast-feeding How should I use this medicine? This device is inserted just under the skin on the inner side of your upper arm by a health care professional. Talk to your pediatrician regarding the use of this medicine in children. Special care may be needed. Overdosage: If you think you have taken too much of this medicine contact a poison control center or emergency room at once. NOTE: This medicine is only for you. Do not share this medicine with others. What if I miss a dose?  This does not apply. What may interact with this medicine? Do not take this medicine with any of the following medications:  amprenavir  fosamprenavir This medicine may also interact with the following medications:  acitretin  aprepitant  armodafinil  bexarotene  bosentan  carbamazepine  certain medicines for fungal infections like fluconazole, ketoconazole, itraconazole and voriconazole  certain medicines to treat hepatitis, HIV or  AIDS  cyclosporine  felbamate  griseofulvin  lamotrigine  modafinil  oxcarbazepine  phenobarbital  phenytoin  primidone  rifabutin  rifampin  rifapentine  St. John's wort  topiramate This list may not describe all possible interactions. Give your health care provider a list of all the medicines, herbs, non-prescription drugs, or dietary supplements you use. Also tell them if you smoke, drink alcohol, or use illegal drugs. Some items may interact with your medicine. What should I watch for while using this medicine? This product does not protect you against HIV infection (AIDS) or other sexually transmitted diseases. You should be able to feel the implant by pressing your fingertips over the skin where it was inserted. Contact your doctor if you cannot feel the implant, and use a non-hormonal birth control method (such as condoms) until your doctor confirms that the implant is in place. Contact your doctor if you think that the implant may have broken or become bent while in your arm. You will receive a user card from your health care provider after the implant is inserted. The card is a record of the location of the implant in your upper arm and when it should be removed. Keep this card with your health records. What side effects may I notice from receiving this medicine? Side effects that you should report to your doctor or health care professional as soon as possible:  allergic reactions like skin rash, itching or hives, swelling of the face, lips, or tongue  breast lumps, breast tissue changes, or discharge  breathing problems  changes in emotions or moods  if you feel that the implant may have broken or bent while in your arm  high blood pressure  pain, irritation, swelling, or bruising at the insertion site  scar at site of insertion  signs of infection at the insertion site such as fever, and skin redness, pain or discharge  signs and symptoms of a blood  clot such as breathing problems; changes in vision; chest pain; severe, sudden headache; pain, swelling, warmth in the leg; trouble speaking; sudden numbness or weakness of the face, arm or leg  signs and symptoms of liver injury like dark yellow or brown urine; general ill feeling or flu-like symptoms; light-colored stools; loss of appetite; nausea; right upper belly pain; unusually weak or tired; yellowing of the eyes or skin  unusual vaginal bleeding, discharge Side effects that usually do not require medical attention (report to your doctor or health care professional if they continue or are bothersome):  acne  breast pain or tenderness  headache  irregular menstrual bleeding  nausea This list may not describe all possible side effects. Call your doctor for medical advice about side effects. You may report side effects to FDA at 1-800-FDA-1088. Where should I keep my medicine? This drug is given in a hospital or clinic and will not be stored at home. NOTE: This sheet is a summary. It may not cover all possible information. If you have questions about this medicine, talk to your doctor, pharmacist, or health care provider.  2020 Elsevier/Gold Standard (2017-05-05 14:11:42)

## 2019-03-24 NOTE — Progress Notes (Signed)
Obstetrics/Postpartum Visit  Appointment Date: 03/24/2019  OBGYN Clinic: [facility]  Primary Care Provider: Patient, No Pcp Per  Chief Complaint:  Chief Complaint  Patient presents with  . Postpartum Care    History of Present Illness: Adriana Ross is a 28 y.o. African-American G2P0010 (Patient's last menstrual period was 05/23/2018.), seen for the above chief complaint. Her past medical history is significant for n/a.  She is s/p 1LTCS on 02/25/19 at 39 weeks; she was discharged to home on POD#3. Pregnancy complicated by gHTN, failed induction, chorioamnionitis.  Complains of nothing. She is doing well.   Vaginal bleeding or discharge: No  Breast or formula feeding: both Intercourse: Yes  Contraception: OCPs PP depression s/s: No  Any bowel or bladder issues: No  Pap smear: ASCUS with NEGATIVE high risk HPV (date: 08/2018)  Review of Systems: Positive for n/a.   Her 12 point review of systems is negative or as noted in the History of Present Illness.  Patient Active Problem List   Diagnosis Date Noted  . Chorioamnionitis 02/25/2019  . Gestational hypertension 02/23/2019  . HSV infection 09/09/2018  . Encounter for supervision of low-risk pregnancy, antepartum 09/07/2018    Medications Karesa M. Ashenfelter had no medications administered during this visit. Current Outpatient Medications  Medication Sig Dispense Refill  . acetaminophen (TYLENOL) 325 MG tablet Take 2 tablets (650 mg total) by mouth every 6 (six) hours as needed for mild pain (temperature > 101.5.). (Patient not taking: Reported on 03/15/2019)    . ferrous sulfate 325 (65 FE) MG tablet Take 325 mg by mouth daily with breakfast.    . labetalol (NORMODYNE) 200 MG tablet Take 1 tablet (200 mg total) by mouth 2 (two) times daily. 60 tablet 3  . norethindrone (MICRONOR) 0.35 MG tablet Take 1 tablet (0.35 mg total) by mouth daily. 1 Package 11  . oxyCODONE (OXY IR/ROXICODONE) 5 MG immediate release tablet Take 1-2  tablets (5-10 mg total) by mouth every 4 (four) hours as needed for moderate pain. (Patient not taking: Reported on 03/15/2019) 30 tablet 0  . Prenat w/o A-FeCbn-Meth-FA-DHA (PRENATE MINI) 29-0.6-0.4-350 MG CAPS Take 1 capsule by mouth daily before breakfast. (Patient not taking: Reported on 03/15/2019) 90 capsule 4  . Prenat-Fe Carbonyl-FA-Omega 3 (ONE-A-DAY WOMENS PRENATAL 1 PO) Take by mouth.    . terconazole (TERAZOL 7) 0.4 % vaginal cream Place 1 applicator vaginally at bedtime. (Patient not taking: Reported on 10/05/2018) 45 g 0  . valACYclovir (VALTREX) 1000 MG tablet Take 1 tablet (1,000 mg total) by mouth daily. (Patient not taking: Reported on 03/15/2019) 30 tablet 2   No current facility-administered medications for this visit.     Allergies Patient has no known allergies.  Physical Exam:  BP 131/90   Pulse 80   Ht 5\' 2"  (1.575 m)   Wt 144 lb 8 oz (65.5 kg)   LMP 05/23/2018   Breastfeeding Yes   BMI 26.43 kg/m  Body mass index is 26.43 kg/m. General appearance: Well nourished, well developed female in no acute distress.  Cardiovascular: regular rate and rhythm Respiratory:  Normal respiratory effort Abdomen: no masses, hernias; diffusely non tender to palpation, non distended, well healed pfannenstiel incision Breasts: not examined. Neuro/Psych:  Normal mood and affect.  Skin:  Warm and dry.    PP Depression Screening:   Edinburgh Postnatal Depression Scale - 03/24/19 1029      Edinburgh Postnatal Depression Scale:  In the Past 7 Days   I have been able  to laugh and see the funny side of things.  0    I have looked forward with enjoyment to things.  0    I have blamed myself unnecessarily when things went wrong.  0    I have been anxious or worried for no good reason.  0    I have felt scared or panicky for no good reason.  0    Things have been getting on top of me.  0    I have been so unhappy that I have had difficulty sleeping.  0    I have felt sad or miserable.   0    I have been so unhappy that I have been crying.  0    The thought of harming myself has occurred to me.  0    Edinburgh Postnatal Depression Scale Total  0       Assessment: Patient is a 28 y.o. G2P0010 who is 4 weeks post partum from a primary c-section. She is doing well.   Plan:   1. Postpartum state No issues  2. Status post C-section Healing well  3. Gestational hypertension, antepartum Cont labetalol 200 mg BID Referral to PCP made  4. Encounter for counseling regarding contraception Reviewed options for contraception, risks/benefits, risks of OCPs and would not recommend them in the setting of hypertension, she would like POPs, gave info for LARC   RTC 4 weeks for BP check   K. Therese Sarah, M.D. Attending Center for [employer] [facility])

## 2019-04-14 ENCOUNTER — Other Ambulatory Visit: Payer: Self-pay

## 2019-04-14 ENCOUNTER — Ambulatory Visit (INDEPENDENT_AMBULATORY_CARE_PROVIDER_SITE_OTHER): Payer: BC Managed Care – PPO

## 2019-04-14 VITALS — BP 134/62

## 2019-04-14 DIAGNOSIS — Z0130 Encounter for examination of blood pressure without abnormal findings: Secondary | ICD-10-CM

## 2019-04-14 NOTE — Progress Notes (Signed)
Subjective:  Adriana Ross is a 28 y.o. female here for BP check hx of GHTN, s/p c/s 02/25/2019.  Hypertension ROS: taking medications as instructed, no medication side effects noted, no TIA's, no chest pain on exertion, no dyspnea on exertion and no swelling of ankles.    Objective:  LMP 05/23/2018   Appearance alert, well appearing, and in no distress. General exam BP noted to be well controlled today in office.    Assessment:   Blood Pressure stable  Plan:  Current treatment effective, no change in therapy.  Referral for PCP sent to [facility] to further management BP

## 2019-05-05 ENCOUNTER — Ambulatory Visit: Payer: Self-pay | Admitting: Internal Medicine

## 2020-02-07 ENCOUNTER — Other Ambulatory Visit (HOSPITAL_COMMUNITY)
Admission: RE | Admit: 2020-02-07 | Discharge: 2020-02-07 | Disposition: A | Discharge status: Home / Self Care | Payer: Managed Care, Other (non HMO) | Source: Ambulatory Visit | Attending: Obstetrics | Admitting: Obstetrics

## 2020-02-07 ENCOUNTER — Other Ambulatory Visit: Payer: Self-pay

## 2020-02-07 ENCOUNTER — Encounter: Payer: Self-pay | Admitting: Obstetrics

## 2020-02-07 ENCOUNTER — Ambulatory Visit (INDEPENDENT_AMBULATORY_CARE_PROVIDER_SITE_OTHER): Payer: Managed Care, Other (non HMO) | Admitting: Obstetrics

## 2020-02-07 VITALS — BP 126/80 | HR 87 | Wt 145.0 lb

## 2020-02-07 DIAGNOSIS — N898 Other specified noninflammatory disorders of vagina: Secondary | ICD-10-CM | POA: Insufficient documentation

## 2020-02-07 DIAGNOSIS — Z01419 Encounter for gynecological examination (general) (routine) without abnormal findings: Secondary | ICD-10-CM

## 2020-02-07 DIAGNOSIS — B373 Candidiasis of vulva and vagina: Secondary | ICD-10-CM

## 2020-02-07 DIAGNOSIS — B3731 Acute candidiasis of vulva and vagina: Secondary | ICD-10-CM

## 2020-02-07 MED ORDER — FLUCONAZOLE 150 MG PO TABS: 150.0000 mg | ORAL_TABLET | Freq: Once | ORAL | 0 refills | Status: AC

## 2020-02-07 NOTE — Progress Notes (Signed)
Subjective:        Adriana Ross is a 29 y.o. female here for a routine exam.  Current complaints: Vaginal discharge with irritation.    Personal health questionnaire:  Is patient Ashkenazi Jewish, have a family history of breast and/or ovarian cancer: no Is there a family history of uterine cancer diagnosed at age < 4, gastrointestinal cancer, urinary tract cancer, family member who is a Personnel officer syndrome-associated carrier: no Is the patient overweight and hypertensive, family history of diabetes, personal history of gestational diabetes, preeclampsia or PCOS: no Is patient over 90, have PCOS,  family history of premature CHD under age 74, diabetes, smoke, have hypertension or peripheral artery disease:  no At any time, has a partner hit, kicked or otherwise hurt or frightened you?: no Over the past 2 weeks, have you felt down, depressed or hopeless?: no Over the past 2 weeks, have you felt little interest or pleasure in doing things?:no   Gynecologic History Patient's last menstrual period was 01/31/2020. Contraception: OCP (estrogen/progesterone) Last Pap: 09-07-2018. Results were: normal Last mammogram: n/a. Results were: n/a  Obstetric History OB History  Gravida Para Term Preterm AB Living  2       1 0  SAB TAB Ectopic Multiple Live Births    1          # Outcome Date GA Lbr Len/2nd Weight Sex Delivery Anes PTL Lv  2 Gravida           1 TAB             History reviewed. No pertinent past medical history.  Past Surgical History:  Procedure Laterality Date  . CESAREAN SECTION N/A 02/25/2019   Procedure: CESAREAN SECTION;  Surgeon: Conan Bowens, MD;  Location: [facility] LD ORS;  Service: Obstetrics;  Laterality: N/A;     Current Outpatient Medications:  .  norgestimate-ethinyl estradiol (MILI) 0.25-35 MG-MCG tablet, Take 1 tablet by mouth daily., Disp: , Rfl:  .  acetaminophen (TYLENOL) 325 MG tablet, Take 2 tablets (650 mg total) by mouth every 6 (six) hours as needed  for mild pain (temperature > 101.5.). (Patient not taking: Reported on 03/15/2019), Disp:  , Rfl:  .  ferrous sulfate 325 (65 FE) MG tablet, Take 325 mg by mouth daily with breakfast. (Patient not taking: Reported on 02/07/2020), Disp: , Rfl:  .  labetalol (NORMODYNE) 200 MG tablet, Take 1 tablet (200 mg total) by mouth 2 (two) times daily. (Patient not taking: Reported on 02/07/2020), Disp: 60 tablet, Rfl: 3 .  norethindrone (MICRONOR) 0.35 MG tablet, Take 1 tablet (0.35 mg total) by mouth daily. (Patient not taking: Reported on 02/07/2020), Disp: 1 Package, Rfl: 11 .  oxyCODONE (OXY IR/ROXICODONE) 5 MG immediate release tablet, Take 1-2 tablets (5-10 mg total) by mouth every 4 (four) hours as needed for moderate pain. (Patient not taking: Reported on 03/15/2019), Disp: 30 tablet, Rfl: 0 .  Prenat w/o A-FeCbn-Meth-FA-DHA (PRENATE MINI) 29-0.6-0.4-350 MG CAPS, Take 1 capsule by mouth daily before breakfast. (Patient not taking: Reported on 03/15/2019), Disp: 90 capsule, Rfl: 4 .  Prenat-Fe Carbonyl-FA-Omega 3 (ONE-A-DAY WOMENS PRENATAL 1 PO), Take by mouth. (Patient not taking: Reported on 02/07/2020), Disp: , Rfl:  .  terconazole (TERAZOL 7) 0.4 % vaginal cream, Place 1 applicator vaginally at bedtime. (Patient not taking: Reported on 10/05/2018), Disp: 45 g, Rfl: 0 .  valACYclovir (VALTREX) 1000 MG tablet, Take 1 tablet (1,000 mg total) by mouth daily. (Patient not taking: Reported on 02/07/2020),  Disp: 30 tablet, Rfl: 2 No Known Allergies  Social History   Tobacco Use  . Smoking status: Former Games developer  . Smokeless tobacco: Never Used  Substance Use Topics  . Alcohol use: Yes    Family History  Problem Relation Age of Onset  . Cancer Maternal Grandmother       Review of Systems  Constitutional: negative for fatigue and weight loss Respiratory: negative for cough and wheezing Cardiovascular: negative for chest pain, fatigue and palpitations Gastrointestinal: negative for abdominal pain and change  in bowel habits Musculoskeletal:negative for myalgias Neurological: negative for gait problems and tremors Behavioral/Psych: negative for abusive relationship, depression Endocrine: negative for temperature intolerance    Genitourinary:negative for abnormal menstrual periods, genital lesions, hot flashes, sexual problems. Positive for vaginal discharge and irritation Integument/breast: negative for breast lump, breast tenderness, nipple discharge and skin lesion(s)    Objective:       BP 126/80   Pulse 87   Wt 145 lb (65.8 kg)   LMP 01/31/2020   BMI 26.52 kg/m  General:   alert and no distress  Skin:   no rash or abnormalities  Lungs:   clear to auscultation bilaterally  Heart:   regular rate and rhythm, S1, S2 normal, no murmur, click, rub or gallop  Breasts:   normal without suspicious masses, skin or nipple changes or axillary nodes  Abdomen:  normal findings: no organomegaly, soft, non-tender and no hernia  Pelvis:  External genitalia: normal general appearance Urinary system: urethral meatus normal and bladder without fullness, nontender Vaginal: normal without tenderness, induration or masses Cervix: normal appearance Adnexa: normal bimanual exam Uterus: anteverted and non-tender, normal size   Lab Review Urine pregnancy test Labs reviewed yes Radiologic studies reviewed no  50% of 20 min visit spent on counseling and coordination of care.   Assessment:     1. Encounter for gynecological examination with Papanicolaou smear of cervix Rx: - Cytology - PAP( [place])  2. Vaginal discharge Rx: - Cervicovaginal ancillary only( [place])  3. Candida vaginitis Rx: - fluconazole (DIFLUCAN) 150 MG tablet; Take 1 tablet (150 mg total) by mouth once for 1 dose.  Dispense: 1 tablet; Refill: 0    Plan:    Education reviewed: calcium supplements, depression evaluation, low fat, low cholesterol diet, safe sex/STD prevention, self breast exams and weight bearing  exercise. Contraception: OCP (estrogen/progesterone). Follow up in: 1 year.     Brock Bad, MD 02/07/2020 9:09 AM

## 2020-02-07 NOTE — Progress Notes (Signed)
ctPt presents for annual and pap Pt c/o vaginal discharge and vaginal irritation.

## 2020-02-08 LAB — CERVICOVAGINAL ANCILLARY ONLY
Bacterial Vaginitis (gardnerella): NEGATIVE
Candida Glabrata: NEGATIVE
Candida Vaginitis: POSITIVE — AB
Chlamydia: NEGATIVE
Comment: NEGATIVE
Comment: NEGATIVE
Comment: NEGATIVE
Comment: NEGATIVE
Comment: NEGATIVE
Comment: NORMAL
Neisseria Gonorrhea: NEGATIVE
Trichomonas: NEGATIVE

## 2020-02-09 ENCOUNTER — Other Ambulatory Visit: Payer: Self-pay | Admitting: Obstetrics

## 2020-02-09 DIAGNOSIS — B379 Candidiasis, unspecified: Secondary | ICD-10-CM

## 2020-02-09 MED ORDER — TERCONAZOLE 0.4 % VA CREA: 1.0000 | TOPICAL_CREAM | Freq: Every day | VAGINAL | 0 refills | Status: AC

## 2020-02-10 LAB — CYTOLOGY - PAP
Diagnosis: NEGATIVE
Diagnosis: REACTIVE

## 2020-03-06 ENCOUNTER — Other Ambulatory Visit: Payer: Self-pay

## 2020-03-06 DIAGNOSIS — Z3009 Encounter for other general counseling and advice on contraception: Secondary | ICD-10-CM

## 2020-03-06 MED ORDER — NORGESTIMATE-ETH ESTRADIOL 0.25-35 MG-MCG PO TABS: 1.0000 | ORAL_TABLET | Freq: Every day | ORAL | 1 refills | Status: AC

## 2020-08-06 ENCOUNTER — Ambulatory Visit: Payer: Managed Care, Other (non HMO) | Admitting: Obstetrics

## 2020-08-14 ENCOUNTER — Other Ambulatory Visit: Payer: Self-pay | Admitting: Obstetrics

## 2020-08-27 ENCOUNTER — Ambulatory Visit: Payer: Managed Care, Other (non HMO) | Admitting: Certified Nurse Midwife

## 2021-04-11 ENCOUNTER — Encounter: Payer: Self-pay | Admitting: *Deleted

## 2021-04-11 ENCOUNTER — Other Ambulatory Visit: Payer: Self-pay

## 2021-04-11 ENCOUNTER — Ambulatory Visit (INDEPENDENT_AMBULATORY_CARE_PROVIDER_SITE_OTHER): Payer: Medicaid Other | Admitting: *Deleted

## 2021-04-11 ENCOUNTER — Other Ambulatory Visit: Payer: Self-pay | Admitting: *Deleted

## 2021-04-11 VITALS — BP 122/82 | HR 79 | Ht 62.0 in | Wt 139.4 lb

## 2021-04-11 DIAGNOSIS — Z3481 Encounter for supervision of other normal pregnancy, first trimester: Secondary | ICD-10-CM

## 2021-04-11 DIAGNOSIS — Z3480 Encounter for supervision of other normal pregnancy, unspecified trimester: Secondary | ICD-10-CM | POA: Insufficient documentation

## 2021-04-11 DIAGNOSIS — N912 Amenorrhea, unspecified: Secondary | ICD-10-CM | POA: Diagnosis not present

## 2021-04-11 LAB — POCT URINE PREGNANCY: Preg Test, Ur: POSITIVE — AB

## 2021-04-11 MED ORDER — VITAFOL GUMMIES 3.33-0.333-34.8 MG PO CHEW: 1.0000 | CHEWABLE_TABLET | Freq: Every day | ORAL | 5 refills | Status: DC

## 2021-04-11 MED ORDER — VITAFOL GUMMIES 3.33-0.333-34.8 MG PO CHEW: CHEWABLE_TABLET | ORAL | 5 refills | Status: AC

## 2021-04-11 NOTE — Progress Notes (Signed)
Rx for Vitafol gummy resent with correct directions.

## 2021-04-11 NOTE — Progress Notes (Signed)
Adriana Ross presents today for UPT. She has no unusual complaints. LMP: 03/02/21    OBJECTIVE: Appears well, in no apparent distress.  OB History     Gravida  2   Para      Term      Preterm      AB  1   Living  0      SAB      IAB  1   Ectopic      Multiple      Live Births             Home UPT Result: positive In-Office UPT result: positive I have reviewed the patient's medical, obstetrical, social, and family histories, and medications.   ASSESSMENT: Positive pregnancy test  PLAN Prenatal care to [facility] at [facility]. RX sent for PNV Signed up for Babys[employer]s Safe Medications in Pregnancy list given. Discussed OB US for dating, OB Intake process, and New OB visit.

## 2021-05-27 ENCOUNTER — Encounter: Payer: Managed Care, Other (non HMO) | Admitting: Advanced Practice Midwife

## 2021-05-27 NOTE — Progress Notes (Deleted)
Subjective:   CARLIS BURNSWORTH is a 30 y.o. G3P0010 at [redacted]w[redacted]d by {Ob dating:14516} being seen today for her first obstetrical visit.  Her obstetrical history is significant for {ob risk factors:10154} and has Encounter for supervision of low-risk pregnancy, antepartum; HSV infection; Gestational hypertension; Chorioamnionitis; and Supervision of other normal pregnancy, antepartum on their problem list.. Patient {does/does not:19097} intend to breast feed. Pregnancy history fully reviewed.  Patient reports {sx:14538}.  HISTORY: OB History  Gravida Para Term Preterm AB Living  3 0 0 0 1 0  SAB IAB Ectopic Multiple Live Births  0 1 0 0 0    # Outcome Date GA Lbr Len/2nd Weight Sex Delivery Anes PTL Lv  3 Current           2 Gravida           1 IAB            No past medical history on file. Past Surgical History:  Procedure Laterality Date   CESAREAN SECTION N/A 02/25/2019   Procedure: CESAREAN SECTION;  Surgeon: Conan Bowens, MD;  Location: [facility] LD ORS;  Service: Obstetrics;  Laterality: N/A;   Family History  Problem Relation Age of Onset   Cancer Maternal Grandmother    Social History   Tobacco Use   Smoking status: Former   Smokeless tobacco: Never  Building services engineer Use: Never used  Substance Use Topics   Alcohol use: Yes   Drug use: Yes    Types: Marijuana   No Known Allergies Current Outpatient Medications on File Prior to Visit  Medication Sig Dispense Refill   acetaminophen (TYLENOL) 325 MG tablet Take 2 tablets (650 mg total) by mouth every 6 (six) hours as needed for mild pain (temperature > 101.5.). (Patient not taking: Reported on 03/15/2019)     ferrous sulfate 325 (65 FE) MG tablet Take 325 mg by mouth daily with breakfast. (Patient not taking: Reported on 02/07/2020)     fluconazole (DIFLUCAN) 150 MG tablet TAKE 1 TABLET BY MOUTH EVERY DAY 1 tablet 0   labetalol (NORMODYNE) 200 MG tablet Take 1 tablet (200 mg total) by mouth 2 (two) times daily.  (Patient not taking: Reported on 02/07/2020) 60 tablet 3   norethindrone (MICRONOR) 0.35 MG tablet Take 1 tablet (0.35 mg total) by mouth daily. (Patient not taking: Reported on 02/07/2020) 1 Package 11   norgestimate-ethinyl estradiol (MILI) 0.25-35 MG-MCG tablet Take 1 tablet by mouth daily. 28 tablet 1   oxyCODONE (OXY IR/ROXICODONE) 5 MG immediate release tablet Take 1-2 tablets (5-10 mg total) by mouth every 4 (four) hours as needed for moderate pain. (Patient not taking: Reported on 03/15/2019) 30 tablet 0   Prenatal Vit-Fe Phos-FA-Omega (VITAFOL GUMMIES) 3.33-0.333-34.8 MG CHEW Chew 3 gummies daily as one dose 90 tablet 5   terconazole (TERAZOL 7) 0.4 % vaginal cream Place 1 applicator vaginally at bedtime. 45 g 0   valACYclovir (VALTREX) 1000 MG tablet Take 1 tablet (1,000 mg total) by mouth daily. (Patient not taking: Reported on 02/07/2020) 30 tablet 2   No current facility-administered medications on file prior to visit.    *** Indications for ASA therapy (per uptodate) One of the following: Previous pregnancy with preeclampsia, especially early onset and with an adverse outcome {yes/no:20286} Multifetal gestation {yes/no:20286} Chronic hypertension {yes/no:20286} Type 1 or 2 diabetes mellitus {yes/no:20286} Chronic kidney disease {yes/no:20286} Autoimmune disease (antiphospholipid syndrome, systemic lupus erythematosus) {yes/no:20286}  *** Two or more of the following:  Nulliparity {yes/no:20286} Obesity (body mass index >30 kg/m2) {yes/no:20286} Family history of preeclampsia in mother or sister {yes/no:20286} Age ?35 years {yes/no:20286} Sociodemographic characteristics (African American race, low socioeconomic level) {yes/no:20286} Personal risk factors (eg, previous pregnancy with low birth weight or small for gestational age infant, previous adverse pregnancy outcome [eg, stillbirth], interval >10 years between pregnancies) {yes/no:20286}  *** Indications for early 1 hour GTT  (per uptodate)  BMI >25 (>23 in Asian women) AND one of the following  Gestational diabetes mellitus in a previous pregnancy {yes/no:20286} Glycated hemoglobin ?5.7 percent (39 mmol/mol), impaired glucose tolerance, or impaired fasting glucose on previous testing {yes/no:20286} First-degree relative with diabetes {yes/no:20286} High-risk race/ethnicity (eg, African American, Latino, Native American, [place] American, [place] Islander) {yes/no:20286} History of cardiovascular disease {yes/no:20286} Hypertension or on therapy for hypertension {yes/no:20286} High-density lipoprotein cholesterol level <35 mg/dL (0.90 mmol/L) and/or a triglyceride level >250 mg/dL (2.82 mmol/L) {yes/no:20286} Polycystic ovary syndrome {yes/no:20286} Physical inactivity {yes/no:20286} Other clinical condition associated with insulin resistance (eg, severe obesity, acanthosis nigricans) {yes/no:20286} Previous birth of an infant weighing ?4000 g {yes/no:20286} Previous stillbirth of unknown cause {yes/no:20286} Exam   There were no vitals filed for this visit.    Uterus:     Pelvic Exam: Perineum: no hemorrhoids, normal perineum   Vulva: normal external genitalia, no lesions   Vagina:  normal mucosa, normal discharge   Cervix: no lesions and normal, pap smear done.    Adnexa: normal adnexa and no mass, fullness, tenderness   Bony Pelvis: average  System: General: well-developed, well-nourished female in no acute distress   Breast:  normal appearance, no masses or tenderness   Skin: normal coloration and turgor, no rashes   Neurologic: oriented, normal, negative, normal mood   Extremities: normal strength, tone, and muscle mass, ROM of all joints is normal   HEENT PERRLA, extraocular movement intact and sclera clear, anicteric   Mouth/Teeth mucous membranes moist, pharynx normal without lesions and dental hygiene good   Neck supple and no masses   Cardiovascular: regular rate and rhythm   Respiratory:  no  respiratory distress, normal breath sounds   Abdomen: soft, non-tender; bowel sounds normal; no masses,  no organomegaly     Assessment:   Pregnancy: G3P0010 Patient Active Problem List   Diagnosis Date Noted   Supervision of other normal pregnancy, antepartum 04/11/2021   Chorioamnionitis 02/25/2019   Gestational hypertension 02/23/2019   HSV infection 09/09/2018   Encounter for supervision of low-risk pregnancy, antepartum 09/07/2018     Plan:  1. Encounter for supervision of other normal pregnancy in first trimester ***  2. [redacted] weeks gestation of pregnancy ***    Initial labs drawn. Continue prenatal vitamins. Discussed and offered genetic screening options, including Quad screen/AFP, NIPS testing, and option to decline testing. Benefits/risks/alternatives reviewed. Pt aware that anatomy US is form of genetic screening with lower accuracy in detecting trisomies than blood work.  Pt chooses/declines genetic screening today. {Blank multiple:19196::"First trimester screen","Quad screen","NIPS"}: {requests/ordered/declines:14581}. Ultrasound discussed; fetal anatomic survey: {requests/ordered/declines:14581}. Problem list reviewed and updated. The nature of [place] with multiple MDs and other Advanced Practice Providers was explained to patient; also emphasized that residents, students are part of our team. Routine obstetric precautions reviewed. No follow-ups on file.   Fatima Blank, CNM 05/27/21 8:09 AM

## 2024-01-11 ENCOUNTER — Encounter: Admitting: Obstetrics and Gynecology
